# Patient Record
Sex: Female | Born: 1935 | Race: White | Hispanic: No | Marital: Married | Smoking: Never smoker
Health system: Southern US, Community
[De-identification: ages and names within clinical notes are randomized; demographics above are authoritative.]

## PROBLEM LIST (undated history)

## (undated) ENCOUNTER — Emergency Department (HOSPITAL_BASED_OUTPATIENT_CLINIC_OR_DEPARTMENT_OTHER): Admission: EM | Payer: Self-pay | Source: Home / Self Care

## (undated) DIAGNOSIS — F329 Major depressive disorder, single episode, unspecified: Secondary | ICD-10-CM

## (undated) DIAGNOSIS — M069 Rheumatoid arthritis, unspecified: Secondary | ICD-10-CM

## (undated) DIAGNOSIS — M1990 Unspecified osteoarthritis, unspecified site: Secondary | ICD-10-CM

## (undated) DIAGNOSIS — R799 Abnormal finding of blood chemistry, unspecified: Secondary | ICD-10-CM

## (undated) DIAGNOSIS — F32A Depression, unspecified: Secondary | ICD-10-CM

## (undated) DIAGNOSIS — N289 Disorder of kidney and ureter, unspecified: Secondary | ICD-10-CM

## (undated) HISTORY — DX: Unspecified osteoarthritis, unspecified site: M19.90

## (undated) HISTORY — PX: BREAST SURGERY: SHX581

## (undated) HISTORY — PX: KNEE SURGERY: SHX244

## (undated) HISTORY — PX: CARPAL TUNNEL RELEASE: SHX101

## (undated) HISTORY — PX: ABDOMINAL HYSTERECTOMY: SHX81

## (undated) HISTORY — DX: Abnormal finding of blood chemistry, unspecified: R79.9

---

## 2000-10-03 ENCOUNTER — Encounter (INDEPENDENT_AMBULATORY_CARE_PROVIDER_SITE_OTHER): Payer: Self-pay

## 2000-10-03 ENCOUNTER — Ambulatory Visit (HOSPITAL_COMMUNITY): Admission: RE | Admit: 2000-10-03 | Discharge: 2000-10-03 | Payer: Self-pay | Admitting: General Surgery

## 2001-08-28 ENCOUNTER — Encounter: Admission: RE | Admit: 2001-08-28 | Discharge: 2001-11-13 | Payer: Self-pay | Admitting: *Deleted

## 2004-05-26 ENCOUNTER — Encounter (INDEPENDENT_AMBULATORY_CARE_PROVIDER_SITE_OTHER): Payer: Self-pay | Admitting: *Deleted

## 2004-11-24 ENCOUNTER — Ambulatory Visit (HOSPITAL_COMMUNITY): Admission: RE | Admit: 2004-11-24 | Discharge: 2004-11-24 | Payer: Self-pay | Admitting: Plastic Surgery

## 2006-05-09 ENCOUNTER — Encounter: Admission: RE | Admit: 2006-05-09 | Discharge: 2006-08-07 | Payer: Self-pay | Admitting: *Deleted

## 2006-10-12 ENCOUNTER — Encounter: Admission: RE | Admit: 2006-10-12 | Discharge: 2006-10-12 | Payer: Self-pay | Admitting: Orthopedic Surgery

## 2006-10-13 ENCOUNTER — Ambulatory Visit (HOSPITAL_BASED_OUTPATIENT_CLINIC_OR_DEPARTMENT_OTHER): Admission: RE | Admit: 2006-10-13 | Discharge: 2006-10-13 | Payer: Self-pay | Admitting: Orthopedic Surgery

## 2007-08-22 ENCOUNTER — Encounter (INDEPENDENT_AMBULATORY_CARE_PROVIDER_SITE_OTHER): Payer: Self-pay | Admitting: *Deleted

## 2007-08-24 ENCOUNTER — Encounter (INDEPENDENT_AMBULATORY_CARE_PROVIDER_SITE_OTHER): Payer: Self-pay | Admitting: *Deleted

## 2007-09-11 ENCOUNTER — Encounter (INDEPENDENT_AMBULATORY_CARE_PROVIDER_SITE_OTHER): Payer: Self-pay | Admitting: *Deleted

## 2007-11-01 ENCOUNTER — Encounter (INDEPENDENT_AMBULATORY_CARE_PROVIDER_SITE_OTHER): Payer: Self-pay | Admitting: *Deleted

## 2008-07-07 ENCOUNTER — Ambulatory Visit (HOSPITAL_COMMUNITY): Admission: RE | Admit: 2008-07-07 | Discharge: 2008-07-07 | Payer: Self-pay | Admitting: *Deleted

## 2008-07-15 ENCOUNTER — Encounter (INDEPENDENT_AMBULATORY_CARE_PROVIDER_SITE_OTHER): Payer: Self-pay | Admitting: *Deleted

## 2008-08-18 ENCOUNTER — Encounter (INDEPENDENT_AMBULATORY_CARE_PROVIDER_SITE_OTHER): Payer: Self-pay | Admitting: *Deleted

## 2008-08-22 ENCOUNTER — Encounter (INDEPENDENT_AMBULATORY_CARE_PROVIDER_SITE_OTHER): Payer: Self-pay | Admitting: *Deleted

## 2008-08-22 ENCOUNTER — Encounter: Admission: RE | Admit: 2008-08-22 | Discharge: 2008-08-22 | Payer: Self-pay | Admitting: Gastroenterology

## 2008-10-09 ENCOUNTER — Emergency Department (HOSPITAL_COMMUNITY): Admission: EM | Admit: 2008-10-09 | Discharge: 2008-10-09 | Payer: Self-pay | Admitting: Emergency Medicine

## 2008-10-23 ENCOUNTER — Ambulatory Visit: Payer: Self-pay | Admitting: *Deleted

## 2008-10-23 DIAGNOSIS — K219 Gastro-esophageal reflux disease without esophagitis: Secondary | ICD-10-CM | POA: Insufficient documentation

## 2008-10-23 DIAGNOSIS — G4700 Insomnia, unspecified: Secondary | ICD-10-CM | POA: Insufficient documentation

## 2008-10-23 DIAGNOSIS — E039 Hypothyroidism, unspecified: Secondary | ICD-10-CM | POA: Insufficient documentation

## 2008-10-23 DIAGNOSIS — E785 Hyperlipidemia, unspecified: Secondary | ICD-10-CM | POA: Insufficient documentation

## 2008-10-23 DIAGNOSIS — F411 Generalized anxiety disorder: Secondary | ICD-10-CM | POA: Insufficient documentation

## 2008-10-23 DIAGNOSIS — F329 Major depressive disorder, single episode, unspecified: Secondary | ICD-10-CM | POA: Insufficient documentation

## 2008-10-23 DIAGNOSIS — Z853 Personal history of malignant neoplasm of breast: Secondary | ICD-10-CM | POA: Insufficient documentation

## 2008-10-23 DIAGNOSIS — K900 Celiac disease: Secondary | ICD-10-CM | POA: Insufficient documentation

## 2008-10-23 DIAGNOSIS — Z85828 Personal history of other malignant neoplasm of skin: Secondary | ICD-10-CM | POA: Insufficient documentation

## 2008-10-23 DIAGNOSIS — M1990 Unspecified osteoarthritis, unspecified site: Secondary | ICD-10-CM | POA: Insufficient documentation

## 2008-10-23 DIAGNOSIS — M3500 Sicca syndrome, unspecified: Secondary | ICD-10-CM | POA: Insufficient documentation

## 2008-10-23 DIAGNOSIS — Z8679 Personal history of other diseases of the circulatory system: Secondary | ICD-10-CM | POA: Insufficient documentation

## 2008-10-28 ENCOUNTER — Encounter (INDEPENDENT_AMBULATORY_CARE_PROVIDER_SITE_OTHER): Payer: Self-pay | Admitting: *Deleted

## 2008-11-21 DIAGNOSIS — K589 Irritable bowel syndrome without diarrhea: Secondary | ICD-10-CM | POA: Insufficient documentation

## 2008-11-21 DIAGNOSIS — R1084 Generalized abdominal pain: Secondary | ICD-10-CM | POA: Insufficient documentation

## 2008-11-21 DIAGNOSIS — E739 Lactose intolerance, unspecified: Secondary | ICD-10-CM | POA: Insufficient documentation

## 2008-11-21 DIAGNOSIS — K5900 Constipation, unspecified: Secondary | ICD-10-CM | POA: Insufficient documentation

## 2008-11-21 DIAGNOSIS — I252 Old myocardial infarction: Secondary | ICD-10-CM | POA: Insufficient documentation

## 2008-11-21 DIAGNOSIS — I499 Cardiac arrhythmia, unspecified: Secondary | ICD-10-CM | POA: Insufficient documentation

## 2008-11-21 DIAGNOSIS — Z8669 Personal history of other diseases of the nervous system and sense organs: Secondary | ICD-10-CM | POA: Insufficient documentation

## 2008-12-26 ENCOUNTER — Telehealth (INDEPENDENT_AMBULATORY_CARE_PROVIDER_SITE_OTHER): Payer: Self-pay | Admitting: *Deleted

## 2008-12-29 ENCOUNTER — Telehealth (INDEPENDENT_AMBULATORY_CARE_PROVIDER_SITE_OTHER): Payer: Self-pay | Admitting: *Deleted

## 2008-12-29 ENCOUNTER — Ambulatory Visit: Payer: Self-pay | Admitting: *Deleted

## 2008-12-29 DIAGNOSIS — R3989 Other symptoms and signs involving the genitourinary system: Secondary | ICD-10-CM | POA: Insufficient documentation

## 2008-12-29 LAB — CONVERTED CEMR LAB
Bilirubin Urine: NEGATIVE
Blood in Urine, dipstick: NEGATIVE
Glucose, Urine, Semiquant: NEGATIVE
Ketones, urine, test strip: NEGATIVE
Protein, U semiquant: NEGATIVE
pH: 5

## 2009-01-02 DIAGNOSIS — E871 Hypo-osmolality and hyponatremia: Secondary | ICD-10-CM | POA: Insufficient documentation

## 2009-01-02 DIAGNOSIS — D72819 Decreased white blood cell count, unspecified: Secondary | ICD-10-CM | POA: Insufficient documentation

## 2009-01-02 LAB — CONVERTED CEMR LAB
ALT: 19 units/L (ref 0–35)
AST: 19 units/L (ref 0–37)
Alkaline Phosphatase: 47 units/L (ref 39–117)
Basophils Absolute: 0 10*3/uL (ref 0.0–0.1)
Basophils Relative: 0.2 % (ref 0.0–3.0)
Calcium: 9.7 mg/dL (ref 8.4–10.5)
Chloride: 95 meq/L — ABNORMAL LOW (ref 96–112)
Cholesterol: 174 mg/dL (ref 0–200)
Creatinine, Ser: 0.8 mg/dL (ref 0.4–1.2)
Eosinophils Absolute: 0 10*3/uL (ref 0.0–0.7)
HDL: 71.9 mg/dL (ref >39.0)
MCHC: 33.9 g/dL (ref 30.0–36.0)
MCV: 98.4 fL (ref 78.0–100.0)
Neutrophils Relative %: 63.5 % (ref 43.0–77.0)
Platelets: 259 10*3/uL (ref 150–400)
RBC: 3.88 M/uL (ref 3.87–5.11)
Total Bilirubin: 1.7 mg/dL — ABNORMAL HIGH (ref 0.3–1.2)
VLDL: 15 mg/dL (ref 0–40)

## 2009-01-19 ENCOUNTER — Telehealth (INDEPENDENT_AMBULATORY_CARE_PROVIDER_SITE_OTHER): Payer: Self-pay | Admitting: *Deleted

## 2009-01-21 ENCOUNTER — Telehealth (INDEPENDENT_AMBULATORY_CARE_PROVIDER_SITE_OTHER): Payer: Self-pay | Admitting: *Deleted

## 2009-01-21 ENCOUNTER — Ambulatory Visit: Payer: Self-pay | Admitting: *Deleted

## 2009-01-21 LAB — CONVERTED CEMR LAB
AST: 27 units/L (ref 0–37)
Alkaline Phosphatase: 54 units/L (ref 39–117)
BUN: 10 mg/dL (ref 6–23)
Basophils Relative: 0.3 % (ref 0.0–3.0)
Creatinine, Ser: 0.7 mg/dL (ref 0.4–1.2)
Eosinophils Absolute: 0 10*3/uL (ref 0.0–0.7)
Eosinophils Relative: 0.7 % (ref 0.0–5.0)
GFR calc non Af Amer: 87 mL/min
Glucose, Bld: 102 mg/dL — ABNORMAL HIGH (ref 70–99)
HCT: 37 % (ref 36.0–46.0)
Hemoglobin: 12.8 g/dL (ref 12.0–15.0)
MCV: 98 fL (ref 78.0–100.0)
Monocytes Absolute: 0.6 10*3/uL (ref 0.1–1.0)
Neutro Abs: 3.8 10*3/uL (ref 1.4–7.7)
RBC: 3.78 M/uL — ABNORMAL LOW (ref 3.87–5.11)
WBC: 5.6 10*3/uL (ref 4.5–10.5)

## 2009-01-22 ENCOUNTER — Telehealth (INDEPENDENT_AMBULATORY_CARE_PROVIDER_SITE_OTHER): Payer: Self-pay | Admitting: *Deleted

## 2009-01-26 ENCOUNTER — Ambulatory Visit: Payer: Self-pay | Admitting: *Deleted

## 2009-01-26 DIAGNOSIS — M2550 Pain in unspecified joint: Secondary | ICD-10-CM | POA: Insufficient documentation

## 2009-08-18 ENCOUNTER — Telehealth: Payer: Self-pay | Admitting: Internal Medicine

## 2009-10-10 IMAGING — NM NM HEPATO W/GB/PHARM/[PERSON_NAME]
2 series · 12 of 12 positions shown · non-contrast
Comparison: None.

CLINICAL DATA: 76-year-old female with abdominal pain, nausea and
gallbladder sludge.

NUCLEAR MEDICINE HEPATOBILIARY IMAGING WITH GALLBLADDER EF
TECHNIQUE: Sequential images of the abdomen were obtained [DATE] minutes following intravenous administration of
radiopharmaceutical. After oral ingestion of 8oz of half and half
cream, gallbladder ejection fraction was determined.
Radiopharmaceutical:  3.AmWi Xc-QQm Choletec

[Series 1: he hepato · 4.71mm/px · 6 of 60 frames shown (1 of 2)]
[frame 6/60]
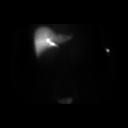
[frame 16/60]
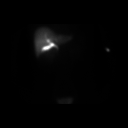
[frame 26/60]
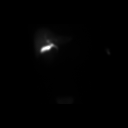
[frame 36/60]
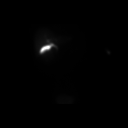
[frame 46/60]
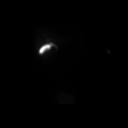
[frame 56/60]
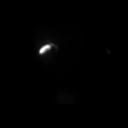

[Series 1: he hepato · 4.71mm/px · 6 of 60 frames shown (2 of 2)]
[frame 6/60]
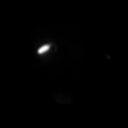
[frame 16/60]
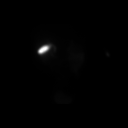
[frame 26/60]
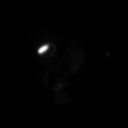
[frame 36/60]
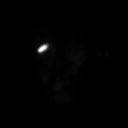
[frame 46/60]
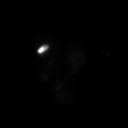
[frame 56/60]
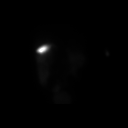

[12 of 12 positions shown; findings below may reference images not displayed]

FINDINGS: Good radiotracer activity uptake in the liver and
clearance of the blood pool.  Common bile duct activity is evident
by 10 minutes and gallbladder activity is seen at 15 minutes and
increases over the course of the study.  After 1 hour, no definite
small bowel activity is identified, but is noted in the second
portion of the exam.

The patient was given 8 ounces of half-and-half orally.  The
patient did not experience symptoms during oral ingestion of Half-
and-Half cream. Gallbladder ejection fraction is calculated at 39%
over 54 minutes.  There is some reaccumulation of radiotracer
activity in the gallbladder at the conclusion of this portion of
the exam. Normal gallbladder ejection fraction is considered to be
greater than 50% at 1 hour.
IMPRESSION: 1.  Decreased gallbladder ejection fraction, 39%.
2.  Otherwise unremarkable hepatic biliary scan.

## 2009-11-25 IMAGING — US US ABDOMEN COMPLETE
1 series · 14 of 25 positions shown · non-contrast
Comparison: None

CLINICAL DATA: Abdominal pain

ABDOMEN ULTRASOUND
TECHNIQUE: Complete abdominal ultrasound examination was performed
including evaluation of the liver, gallbladder, bile ducts,
pancreas, kidneys, spleen, IVC, and abdominal aorta.

[Series 1: us abdomen complete · 0.17mm/px · 14 of 66 slices shown]
[im 1/66]
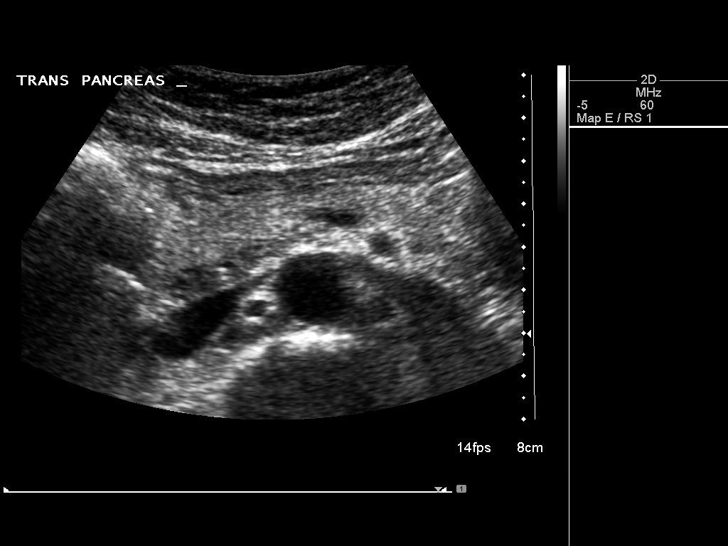
[im 6/66]
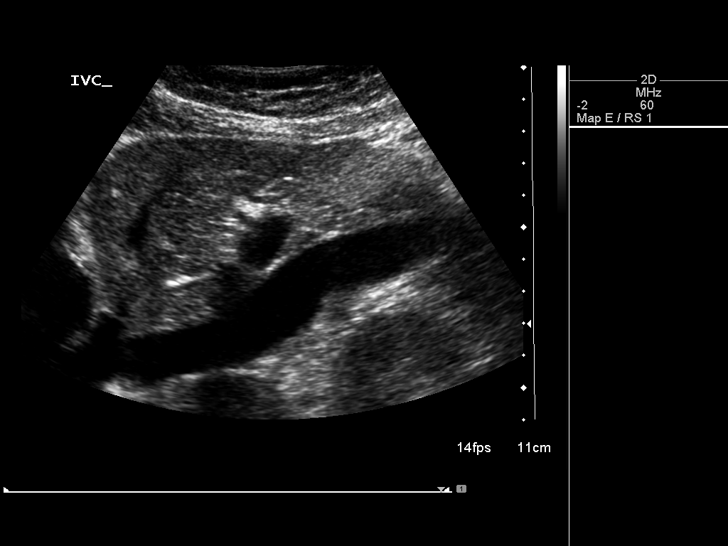
[im 11/66]
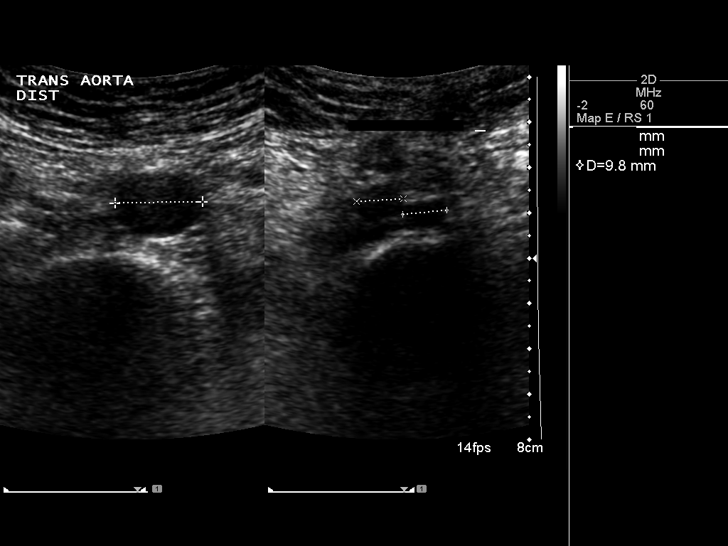
[im 17/66]
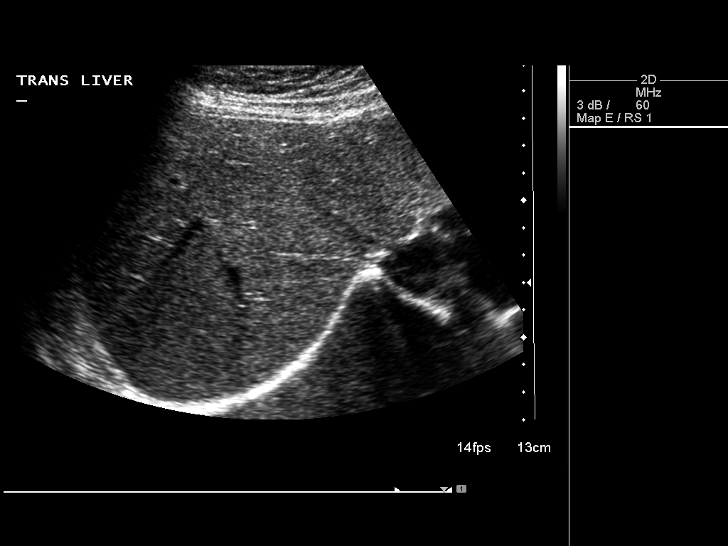
[im 22/66]
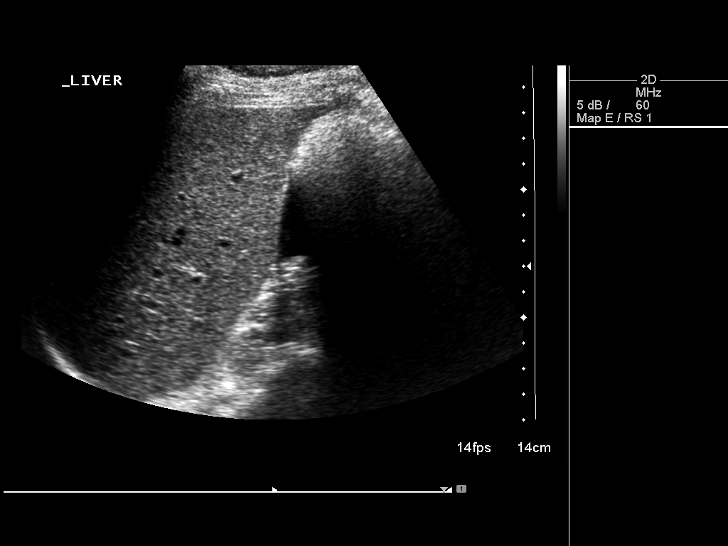
[im 25/66]
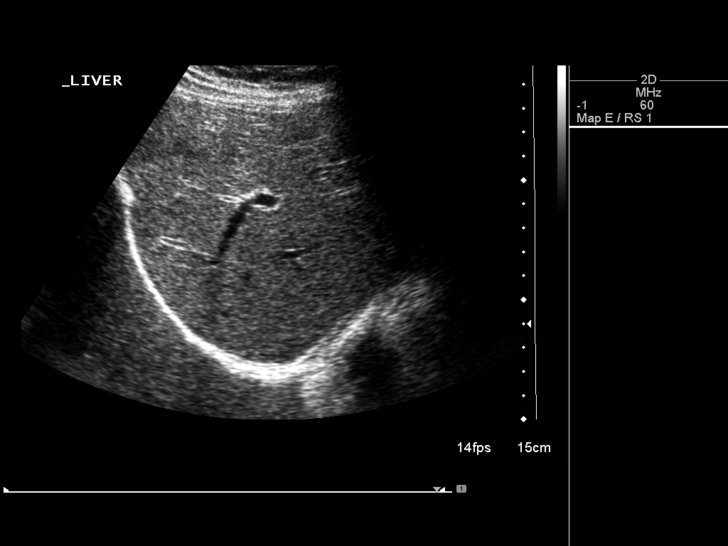
[im 30/66]
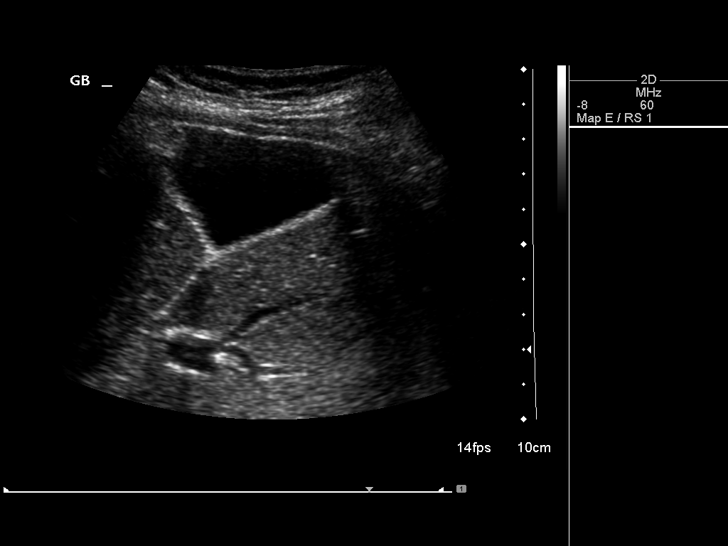
[im 36/66]
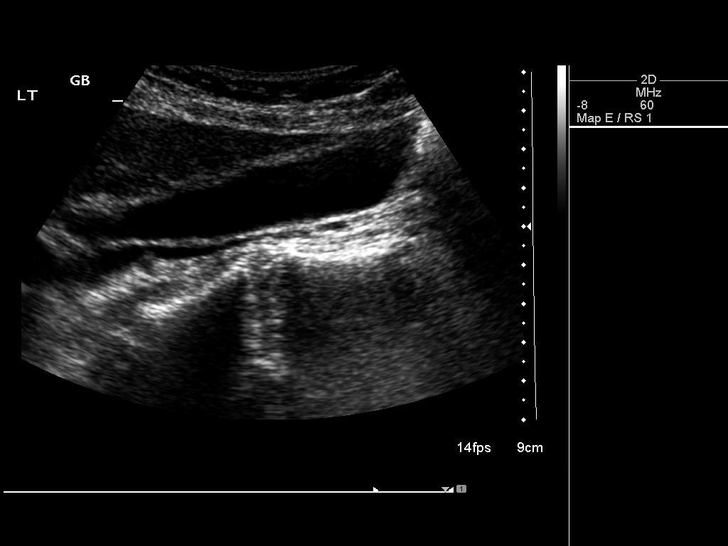
[im 41/66]
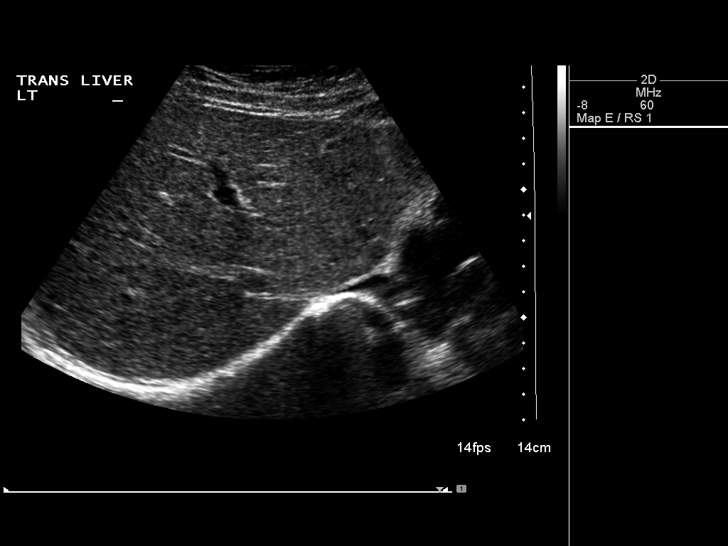
[im 44/66]
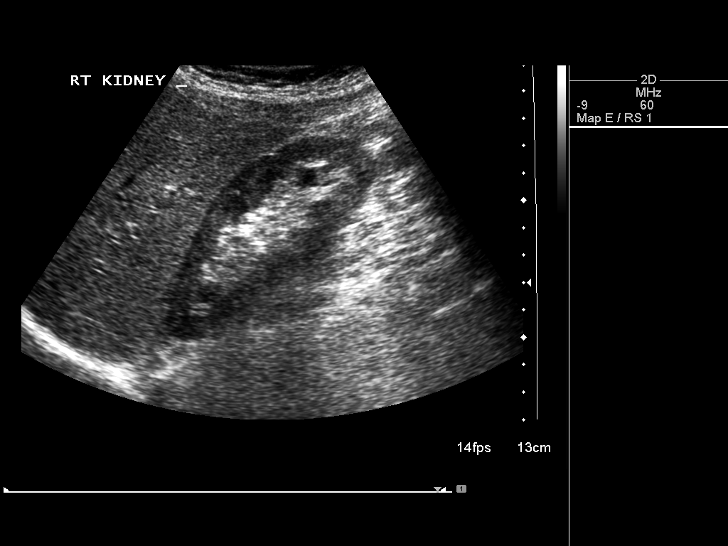
[im 49/66]
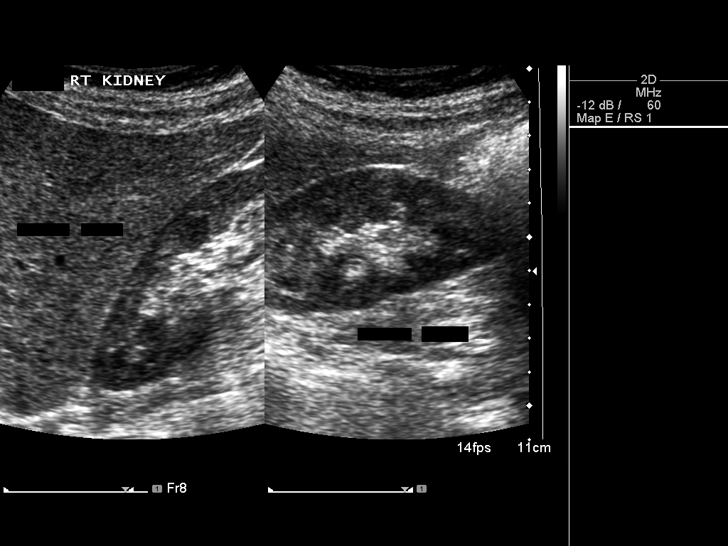
[im 55/66]
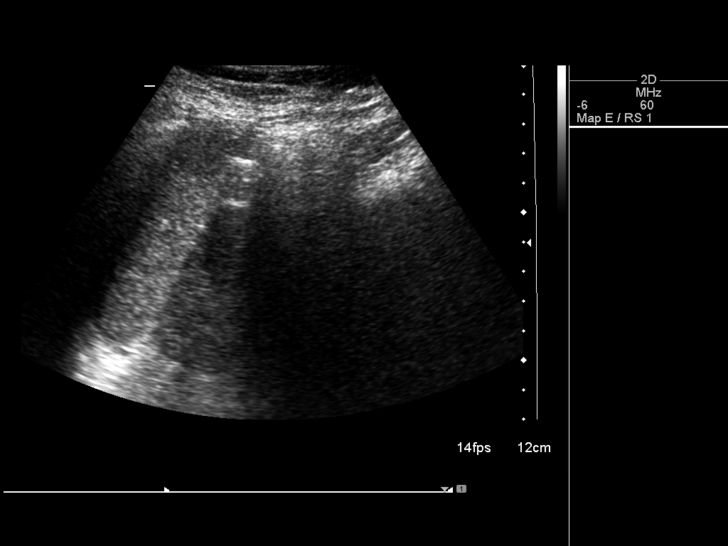
[im 60/66]
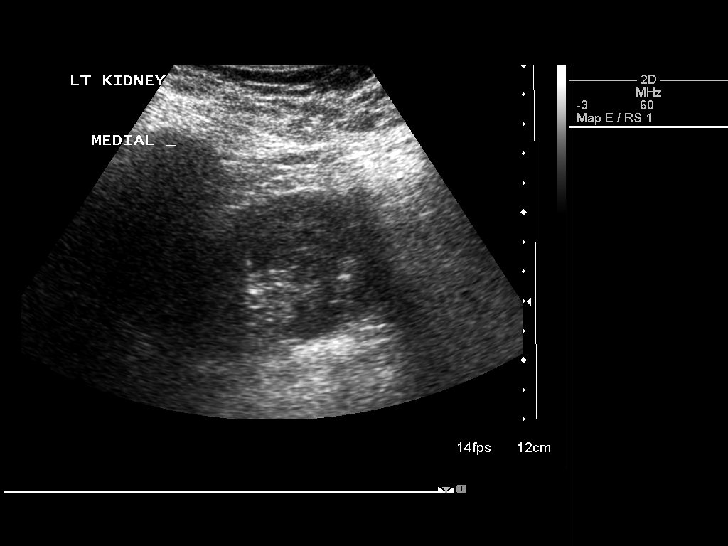
[im 66/66]
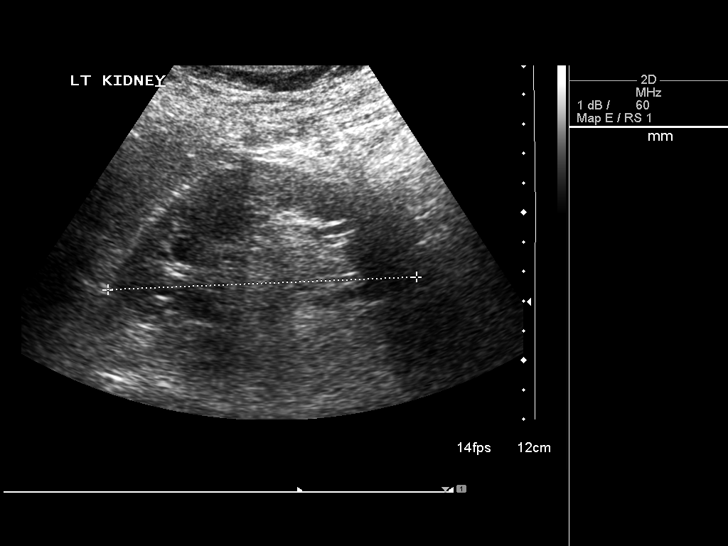

[14 of 25 positions shown; findings below may reference images not displayed]

FINDINGS: The gallbladder is well seen and no gallstones are noted.
The liver has a normal echogenic pattern.  The common bile duct is
normal measuring 2.8 mm in diameter.  The IVC, pancreas, and spleen
appear normal.  No hydronephrosis is seen.  The right kidney
measures 10.3 cm sagittally, with the left kidney measuring
cm.  The abdominal aorta is normal in caliber.
IMPRESSION: Negative ultrasound.  No gallstones.  No ductal dilatation.

## 2011-04-01 NOTE — Op Note (Signed)
[facility]  Patient:    Vickie Roberts, Vickie Roberts                        MRN: [id number] Proc. Date: 10/03/00 Adm. Date:  [id number] Attending:  Carson Myrtle CC:         Festus Holts, M.D.   Operative Report  PREOPERATIVE DIAGNOSIS:  Mass, right breast.  POSTOPERATIVE DIAGNOSIS:  Mass, right breast.  PROCEDURE:  Excision of mass, right breast.  SURGEON:  Timothy E. Earlene Plater, M.D.  ANESTHESIA:  IV sedation, CRNA standby.  INDICATIONS:  The patient is an otherwise healthy 77 year old.  She has previously had bilateral subcutaneous mastectomies because of a high risk for cancer and implants have been replaced.  She developed a nodular mass at the upper outer quadrant of the right implant, and because of the suspicious nature, she wishes to have a biopsy, and I agree.  She agrees and understands the procedure.  DESCRIPTION OF PROCEDURE:  The patient was taken to the operating room, IV started, and sedation given.  She was placed in the elevated left side down position with the right arm outstretched.  The breast was prepped and draped in the usual fashion.  The mass was palpable at the outer edge of the implant at approximately the 10 oclock position.  An old incision from a previous biopsy was present.  This area was anesthetized and the old scar was used to enter the subcutaneous space.  The nodularity appeared to be scar tissue at the edge of the implant and the border of the pectoralis muscle.  The nodularity was excised sharply.  Small bleeding points were cauterized. Several small areas were removed and submitted together for pathology in _______.  The area was clean and smooth after this.  Bleeding was controlled. The wound was dry and it was closed with 3-0 Vicryl and 4-0 Monocryl. Steri-Strips applied.  Counts correct.  She tolerated it well and was removed to the recovery room in good condition.  Written and verbal instructions were  given to her and her daughter, Darvocet-N #24 one refill, and she will be seen in follow up in the office. DD:  10/03/00 TD:  10/04/00 Job: [id number]

## 2011-04-01 NOTE — Op Note (Signed)
[name], Vickie Roberts                 ACCOUNT NO.:  [id number]   MEDICAL RECORD NO.:  [id number]          PATIENT TYPE:  AMB   LOCATION:  DSC                          FACILITY:  [facility]   PHYSICIAN:  Harvie Junior, M.D.   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   PRINCIPAL PROCEDURE:  1. Partial posterior horn medial meniscectomy.  2. Debridement of chondromalacia in the lateral compartment and the      patellofemoral compartment.   SURGEON:  Harvie Junior, M.D.   ASSISTANT:  Marshia Ly, P.A.   ANESTHESIA:  General.   BRIEF HISTORY:  [name] is a 77 year old female with a long history of  having had significant knee pain.  We have treated her conservatively  for a long period of time.  Anti-inflammatory medication injection has  failed her and because of continued complaints of pain, she is  ultimately taken to the operating room for operative knee arthroscopy.  Preoperative plain x-ray did not show any dramatic bone-on-bone  narrowing and preoperative MRI was negative for meniscal tear.  She was  brought to the operating room after failure of conservative care for an  operative knee arthroscopy.   PROCEDURE:  The patient was brought to the operating room.  After  adequate anesthesia was obtained with general anesthetic, the patient  was placed supine on the operating table.  The right leg was then  prepped and draped in the usual, sterile fashion.  Following this,  routine arthroscopic examination of the knee revealed there was obvious  and significant chondromalacia in the medial compartment.  She also had  a posterior horn medial meniscal tear which  was debrided back to a  smooth and stable rim.  The chondromalacia in the medial compartment,  lateral compartment and patellofemoral compartment was debrided by way  of chondroplasty.  Once we had completed this process, a final check was  made for loose fragments or pieces of cartilage.  Seeing none, the knee  was copiously and thoroughly irrigated and suctioned dry.  The  arthroscopic portal was closed with a bandage.  Sterile compressive  dressing was applied and the patient was taken to the recovery room  where she was noted to be in satisfactory condition.   ESTIMATED BLOOD LOSS:  Estimated blood loss for the procedure was none.      Harvie Junior, M.D.  Electronically Signed     JLG/MEDQ  D:  11/19/2006  T:  11/19/2006  Job:  [id number]

## 2011-08-16 LAB — URINALYSIS, ROUTINE W REFLEX MICROSCOPIC
Glucose, UA: NEGATIVE
Nitrite: NEGATIVE
Protein, ur: NEGATIVE
pH: 7.5

## 2011-08-16 LAB — POCT I-STAT, CHEM 8
BUN: 7
Chloride: 94 — ABNORMAL LOW
HCT: 39
Sodium: 131 — ABNORMAL LOW
TCO2: 26

## 2014-12-17 ENCOUNTER — Ambulatory Visit
Admission: RE | Admit: 2014-12-17 | Discharge: 2014-12-17 | Disposition: A | Discharge status: Home / Self Care | Payer: Medicare Other | Source: Ambulatory Visit | Attending: Family Medicine | Admitting: Family Medicine

## 2014-12-17 ENCOUNTER — Other Ambulatory Visit: Payer: Self-pay | Admitting: Family Medicine

## 2014-12-17 DIAGNOSIS — R059 Cough, unspecified: Secondary | ICD-10-CM

## 2014-12-17 DIAGNOSIS — R05 Cough: Secondary | ICD-10-CM

## 2015-04-14 ENCOUNTER — Encounter (HOSPITAL_BASED_OUTPATIENT_CLINIC_OR_DEPARTMENT_OTHER): Payer: Self-pay | Admitting: Emergency Medicine

## 2015-04-14 ENCOUNTER — Emergency Department (HOSPITAL_BASED_OUTPATIENT_CLINIC_OR_DEPARTMENT_OTHER)
Admission: EM | Admit: 2015-04-14 | Discharge: 2015-04-15 | Disposition: A | Discharge status: Home / Self Care | Payer: Medicare Other | Attending: Emergency Medicine | Admitting: Emergency Medicine

## 2015-04-14 ENCOUNTER — Emergency Department (HOSPITAL_BASED_OUTPATIENT_CLINIC_OR_DEPARTMENT_OTHER): Payer: Medicare Other

## 2015-04-14 DIAGNOSIS — Z791 Long term (current) use of non-steroidal anti-inflammatories (NSAID): Secondary | ICD-10-CM | POA: Insufficient documentation

## 2015-04-14 DIAGNOSIS — S20211A Contusion of right front wall of thorax, initial encounter: Secondary | ICD-10-CM | POA: Insufficient documentation

## 2015-04-14 DIAGNOSIS — Z79899 Other long term (current) drug therapy: Secondary | ICD-10-CM | POA: Diagnosis not present

## 2015-04-14 DIAGNOSIS — W050XXA Fall from non-moving wheelchair, initial encounter: Secondary | ICD-10-CM | POA: Diagnosis not present

## 2015-04-14 DIAGNOSIS — S0003XA Contusion of scalp, initial encounter: Secondary | ICD-10-CM | POA: Diagnosis not present

## 2015-04-14 DIAGNOSIS — F329 Major depressive disorder, single episode, unspecified: Secondary | ICD-10-CM | POA: Insufficient documentation

## 2015-04-14 DIAGNOSIS — Y998 Other external cause status: Secondary | ICD-10-CM | POA: Diagnosis not present

## 2015-04-14 DIAGNOSIS — Y9389 Activity, other specified: Secondary | ICD-10-CM | POA: Diagnosis not present

## 2015-04-14 DIAGNOSIS — Z7952 Long term (current) use of systemic steroids: Secondary | ICD-10-CM | POA: Diagnosis not present

## 2015-04-14 DIAGNOSIS — M069 Rheumatoid arthritis, unspecified: Secondary | ICD-10-CM | POA: Diagnosis not present

## 2015-04-14 DIAGNOSIS — W19XXXA Unspecified fall, initial encounter: Secondary | ICD-10-CM

## 2015-04-14 DIAGNOSIS — S299XXA Unspecified injury of thorax, initial encounter: Secondary | ICD-10-CM | POA: Diagnosis present

## 2015-04-14 DIAGNOSIS — Y92009 Unspecified place in unspecified non-institutional (private) residence as the place of occurrence of the external cause: Secondary | ICD-10-CM | POA: Diagnosis not present

## 2015-04-14 HISTORY — DX: Major depressive disorder, single episode, unspecified: F32.9

## 2015-04-14 HISTORY — DX: Rheumatoid arthritis, unspecified: M06.9

## 2015-04-14 HISTORY — DX: Depression, unspecified: F32.A

## 2015-04-14 NOTE — ED Notes (Signed)
Slipped while maneuvering a wheelchair, hitting right side of head and right ribs.  Area of swelling on head.  Skin intact.  No blood thinners.

## 2015-04-14 NOTE — ED Notes (Signed)
Pt loss balance and fell  C/o pain to rt back area, and left side of head  Small hematoma,  No loc,  Denies n/v, no blurred vision

## 2015-04-15 NOTE — ED Provider Notes (Signed)
CSN: [id number]     Arrival date & time 04/14/15  2026 History   First MD Initiated Contact with Patient 04/15/15 0030     Chief Complaint  Patient presents with  . Fall     (Consider location/radiation/quality/duration/timing/severity/associated sxs/prior Treatment) HPI  This is an 81 year old female with a history of sciatica. Because of her sciatica she ambulates either with the use of walker or uses a wheelchair. She was reaching for her wheelchair yesterday evening about 7 PM. The break on the wheelchair was not set and it slid and she fell to the ground. She struck her right lower posterior rib cage and the right side of her head. There was no loss of consciousness. She is having no nausea or vomiting. She denies any neck or spinal pain. Pain in the back is moderate to severe with palpation, mild with deep breathing or coughing. She denies other injury.  Past Medical History  Diagnosis Date  . RA (rheumatoid arthritis)   . Depression    Past Surgical History  Procedure Laterality Date  . Knee surgery    . Carpal tunnel release    . Breast surgery     No family history on file. History  Substance Use Topics  . Smoking status: Never Smoker   . Smokeless tobacco: Not on file  . Alcohol Use: No   OB History    No data available     Review of Systems  All other systems reviewed and are negative.   Allergies  Erythromycin; Hydrocodone; and Sulfonamide derivatives  Home Medications   Prior to Admission medications   Medication Sig Start Date End Date Taking? Authorizing Provider  buPROPion (WELLBUTRIN) 100 MG tablet Take 150 mg by mouth daily.   Yes Historical Provider, MD  clonazePAM (KLONOPIN) 0.5 MG tablet Take 0.5 mg by mouth at bedtime.   Yes Historical Provider, MD  L-Methylfolate-Algae (DEPLIN 7.5 PO) Take by mouth.   Yes Historical Provider, MD  predniSONE (DELTASONE) 5 MG tablet Take 5 mg by mouth daily with breakfast.   Yes Historical Provider, MD  traMADol  (ULTRAM) 50 MG tablet Take 100 mg by mouth 2 (two) times daily.   Yes Historical Provider, MD   BP 135/89 mmHg  Pulse 66  Temp(Src) 98.1 F (36.7 C) (Oral)  Resp 16  Ht 5\' 4"  (1.626 m)  Wt 153 lb (69.4 kg)  BMI 26.25 kg/m2  SpO2 98%   Physical Exam  General: Well-developed, well-nourished female in no acute distress; appearance consistent with age of record HENT: normocephalic; small hematoma to right parietal scalp; no hemotympanum Eyes: pupils equal, round and reactive to light; extraocular muscles intact; lens implants Neck: supple; nontender Heart: regular rate and rhythm Lungs: clear to auscultation bilaterally Chest: Well localized tender, ecchymotic area of right lower posterior ribs without crepitus or deformity Abdomen: soft; nondistended; nontender; bowel sounds present Extremities: No deformity; full range of motion except right hip due to sciatic pain; pulses normal; trace edema of lower legs Neurologic: Awake, alert and oriented; motor function intact in all extremities and symmetric; no facial droop Skin: Warm and dry Psychiatric: Normal mood and affect    ED Course  Procedures (including critical care time)   MDM  Nursing notes and vitals signs, including pulse oximetry, reviewed.  Summary of this visit's results, reviewed by myself:  Imaging Studies: Dg Ribs Unilateral W/chest Right  04/14/2015   CLINICAL DATA:  Slipped while moving wheelchair with right-sided rib pain, initial encounter  EXAM: RIGHT  RIBS AND CHEST - 3+ VIEW  COMPARISON:  None.  FINDINGS: No fracture or other bone lesions are seen involving the ribs. There is no evidence of pneumothorax or pleural effusion. Both lungs are clear. Heart size and mediastinal contours are within normal limits.  IMPRESSION: No acute abnormality noted.   Electronically Signed   By: Alcide Clever M.D.   On: 04/14/2015 21:45   The patient has tramadol and ibuprofen at home for pain. Although the patient has point  tenderness lack of significant pain on deep breathing or coughing suggests she does not have a rib fracture.   Paula Libra, MD 04/15/15 [phone]

## 2015-06-15 ENCOUNTER — Encounter: Payer: Self-pay | Admitting: Neurology

## 2015-06-15 ENCOUNTER — Ambulatory Visit (INDEPENDENT_AMBULATORY_CARE_PROVIDER_SITE_OTHER): Payer: Medicare Other | Admitting: Neurology

## 2015-06-15 VITALS — BP 126/60 | HR 88 | Resp 16 | Ht 64.0 in | Wt 142.0 lb

## 2015-06-15 DIAGNOSIS — M79604 Pain in right leg: Secondary | ICD-10-CM

## 2015-06-15 NOTE — Progress Notes (Signed)
Subjective:    Patient ID: Vickie Roberts is a 81 y.o. female.  HPI     Huston Foley, MD, PhD [facility] [address] [address] [place], [place] [address]  Dear Dr. Luiz Blare,   I saw your patient, Vickie Roberts, upon your kind request in my neurologic clinic today for initial consultation of her right leg pain and weakness. The patient is accompanied by her husband today. 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 Pain is from the right gluteal region, down the lateral aspect of her right leg, into the foot. She felt symptoms started gradually and progressed but then improved slightly. She is in outpatient physical therapy. She has a muscle relaxer. She does not take this very often. She has had 2 rounds of oral stare rides and also 3 injections into her lower back. Her husband feels that she has improved some. She has no family history of muscle or nerve diseases. She saw a foot doctor and has been seeing a [facility]. You considered injections into the right knee but she declined at the time. I reviewed your office note from 05/21/2015 which you kindly included. Of note, she presented to the emergency room on 04/14/2015 after a fall. I reviewed the emergency room records. At her most recent baseline she has been using a walker or wheelchair depending on her lower back pain level. She fell as she was reaching for her wheelchair which was not stabilized and rolled away and she fell onto her right side and hit her head. She had no loss of consciousness. She had a rib and chest x-ray on 04/14/2015 that showed no acute abnormalities.  She had a MRI lumbar spine without contrast on 03/03/2015: Diffuse facet  osteoarthritis with mild degenerative disc disease as above. No nerve impingement or explanation for radicular symptoms.   Her Past Medical History Is Significant For: Past Medical History  Diagnosis Date  . RA (rheumatoid arthritis)   . Depression   . Osteoarthritis     Her Past Surgical History Is Significant For: Past Surgical History  Procedure Laterality Date  . Knee surgery    . Carpal tunnel release    . Breast surgery    . Abdominal hysterectomy      Her Family History Is Significant For: Family History  Problem Relation Age of Onset  . Heart disease Mother   . Breast cancer Mother   . Heart disease Father     Her Social History Is Significant For: History   Social History  . Marital Status: Married    Spouse Name: N/A  . Number of Children: 4  . Years of Education: HS   Occupational History  . Retired    Social History Main Topics  . Smoking status: Never Smoker   . Smokeless tobacco: Not on file  . Alcohol Use: No  . Drug Use: No  . Sexual Activity: Yes    Birth Control/ Protection: Post-menopausal   Other Topics Concern  . None   Social History Narrative   Occasional caffeine use     Her Allergies Are:  Allergies  Allergen Reactions  . Erythromycin   . Hydrocodone   . Sulfonamide Derivatives   :   Her Current Medications Are:  Outpatient Encounter Prescriptions as of 06/15/2015  Medication Sig  . aspirin 81 MG tablet Take 81 mg by mouth daily.  . Biotin 1 MG CAPS Take by mouth.  Marland Kitchen buPROPion (WELLBUTRIN SR) 150 MG 12 hr tablet   . buPROPion (WELLBUTRIN) 100 MG tablet Take 150 mg by mouth daily.  . cholecalciferol (VITAMIN D) 1000 UNITS tablet Take 1,000 Units by mouth daily.  . clonazePAM (KLONOPIN) 0.5 MG tablet Take 0.5 mg by mouth at bedtime.  Marland Kitchen L-Methylfolate-Algae (DEPLIN 7.5 PO) Take by mouth.  . leflunomide (ARAVA) 20 MG tablet   . methocarbamol (ROBAXIN) 500 MG tablet Take 500 mg by mouth 4 (four) times daily.  . predniSONE  (DELTASONE) 5 MG tablet Take 5 mg by mouth daily with breakfast.  . [DISCONTINUED] traMADol (ULTRAM) 50 MG tablet Take 100 mg by mouth 2 (two) times daily.   No facility-administered encounter medications on file as of 06/15/2015.  :   Review of Systems:  Out of a complete 14 point review of systems, all are reviewed and negative with the exception of these symptoms as listed below:  Review of Systems  Constitutional: Positive for fatigue.  Gastrointestinal: Positive for diarrhea.  Neurological: Positive for weakness.  Psychiatric/Behavioral:       Depression, anxiety, decreased energy     Objective:  Neurologic Exam  Physical Exam Physical Examination:   Filed Vitals:   06/15/15 1331  BP: 126/60  Pulse: 88  Resp: 16   General Examination: The patient is a very pleasant 81 y.o. female in no acute distress. She appears well-developed and well-nourished and well groomed. She is situated in her wheelchair. She did not bring a walker or cane today.  HEENT: Normocephalic, atraumatic, pupils are equal, round and reactive to light and accommodation. Funduscopic exam is normal with sharp disc margins noted. Extraocular tracking is good without limitation to gaze excursion or nystagmus noted. Normal smooth pursuit is noted. Hearing is grossly intact. Tympanic membranes are clear bilaterally. Face is symmetric with normal facial animation and normal facial sensation. Speech is clear with no dysarthria noted. There is no hypophonia. There is no lip, neck/head, jaw or voice tremor. Neck is supple with full range of passive and active motion. There are no carotid bruits on auscultation. Oropharynx exam reveals: mild mouth dryness, adequate dental hygiene and mild airway.  Chest: Clear to auscultation without wheezing, rhonchi or crackles noted.  Heart: S1+S2+0, regular and normal without murmurs, rubs or gallops noted.   Abdomen: Soft, non-tender and non-distended with normal bowel sounds  appreciated on auscultation.  Extremities: There is no pitting edema in the distal lower extremities bilaterally. Pedal pulses are intact.  Skin: Warm and dry without trophic changes noted. There are no varicose veins.  Musculoskeletal: exam reveals no obvious joint deformities, tenderness or joint swelling or erythema.   Neurologically:  Mental status: The patient is awake, alert and oriented in all 4 spheres. Her immediate and remote memory, attention, language skills and fund of knowledge are appropriate. There is no evidence of aphasia, agnosia, apraxia or anomia. Speech is clear with normal prosody and enunciation. Thought process is linear. Mood is normal and affect is normal.  Cranial nerves II - XII are as described above under HEENT exam. In addition: shoulder shrug is normal with equal shoulder height noted. Motor exam: Normal bulk, strength and tone is noted in both upper extremities and left lower extremity. She has a mild degree of weakness in the right lower extremity and the 4+  out of 5 range, possibly limited secondary to pain. There is no drift, tremor or rebound. Romberg is negative. Reflexes are 2+ in the upper extremities, 1+ in both knees, trace in both ankles. Babinski: Toes are flexor bilaterally. Fine motor skills and coordination: intact with normal finger taps, normal hand movements, normal rapid alternating patting, normal foot taps and normal foot agility.  Cerebellar testing: No dysmetria or intention tremor on finger to nose testing. Heel to shin is unremarkable bilaterally. There is no truncal or gait ataxia.  Sensory exam: intact to light touch, pinprick, vibration, temperature sense in the upper and lower extremities, with the exception of mild decrease in temperature and pinprick sensation in the right lateral foot and right distal lateral leg.  Gait, station and balance: She stands with difficulty. No veering to one side is noted. No leaning to one side is noted.  Posture is age-appropriate and stance is slightly wide-based. She walks cautiously with a slight limp on the right side. She turns in 3 steps. Tandem walk is not possible for her. She reports mild pain with standing and walking. It is in the right lower back with some radiation reported.   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 She had an MRI of her lower back and also x-ray of her right knee. Her neurological exam does not suggest a widespread neuropathy. She has preserved reflexes. Her strength is for the most part preserved with some pain limitation noted. She does have a slight limp on gait testing. She is advised to use her walker or cane at all times. I did suggest that we do some blood work including muscle enzymes and inflammatory markers. We will also go ahead and do an EMG nerve conduction test and we will call her with her test results. I will see her back routinely in follow-up in 2 months, sooner if needed. She is encouraged to continue her physical therapy. I'm not quite sure how to explain her pain at this time. We will do further testing and keep her posted as far as the test results. I answered all her questions today and the patient and her husband were in agreement.   Thank you very much for allowing me to participate in the care of this nice patient. If I can be of any further assistance to you please do not hesitate to call me at [phone].  Sincerely,   Huston Foley, MD, PhD

## 2015-06-15 NOTE — Patient Instructions (Signed)
I am not sure how to explain your leg pain.   I would like to investigate things further to look for evidence of neuropathy or nerve damage; therefore, I would like to do some blood work, and electrical testing of your muscles and nerves, which is known as EMG/NCV. Neuropathy or nerve disease or damage can be caused by a variety of causes, most commonly diabetes, some toxins including alcohol or metabolic derangements or hereditary disorders.

## 2015-06-17 LAB — MULTIPLE MYELOMA PANEL, SERUM
ALBUMIN/GLOB SERPL: 1.5 (ref 0.7–1.7)
ALPHA2 GLOB SERPL ELPH-MCNC: 0.8 g/dL (ref 0.4–1.0)
Albumin SerPl Elph-Mcnc: 3.7 g/dL (ref 2.9–4.4)
Alpha 1: 0.3 g/dL (ref 0.0–0.4)
B-GLOBULIN SERPL ELPH-MCNC: 1.1 g/dL (ref 0.7–1.3)
GAMMA GLOB SERPL ELPH-MCNC: 0.5 g/dL (ref 0.4–1.8)
Globulin, Total: 2.6 g/dL (ref 2.2–3.9)
IGA/IMMUNOGLOBULIN A, SERUM: 212 mg/dL (ref 64–422)
IGG (IMMUNOGLOBIN G), SERUM: 542 mg/dL — AB (ref 700–1600)
IGM (IMMUNOGLOBULIN M), SRM: 50 mg/dL (ref 26–217)
TOTAL PROTEIN: 6.3 g/dL (ref 6.0–8.5)

## 2015-06-17 LAB — HGB A1C W/O EAG: Hgb A1c MFr Bld: 5.7 % — ABNORMAL HIGH (ref 4.8–5.6)

## 2015-06-17 LAB — HEAVY METALS PROFILE II, BLOOD
ARSENIC: 6 ug/L (ref 2–23)
CADMIUM: NOT DETECTED ug/L (ref 0.0–1.2)
Lead, Blood: NOT DETECTED ug/dL (ref 0–19)
MERCURY: 4.3 ug/L (ref 0.0–14.9)

## 2015-06-17 LAB — ANA W/REFLEX: Anti Nuclear Antibody(ANA): NEGATIVE

## 2015-06-17 LAB — CK: CK TOTAL: 37 U/L (ref 24–173)

## 2015-06-17 LAB — C-REACTIVE PROTEIN: CRP: 0.3 mg/L (ref 0.0–4.9)

## 2015-06-17 LAB — SEDIMENTATION RATE: SED RATE: 2 mm/h (ref 0–40)

## 2015-06-17 NOTE — Progress Notes (Signed)
Quick Note:  Please call and advise the patient that the recent labs we checked were within normal limits. No further action is required on these tests at this time. Please remind patient to keep any upcoming appointments or tests and to call us with any interim questions, concerns, problems or updates. Thanks,  Arelis Neumeier, MD, PhD    ______ 

## 2015-06-18 ENCOUNTER — Telehealth: Payer: Self-pay

## 2015-06-18 NOTE — Telephone Encounter (Signed)
Spoke to husband and gave him results and recommendation. (Patient was sleeping)

## 2015-06-18 NOTE — Telephone Encounter (Signed)
-----   Message from Huston Foley, MD sent at 06/17/2015  8:10 AM EDT ----- Please call and advise the patient that the recent labs we checked were within normal limits. No further action is required on these tests at this time. Please remind patient to keep any upcoming appointments or tests and to call us with any interim questions, concerns, problems or updates. Thanks,  Huston Foley, MD, PhD

## 2015-06-29 ENCOUNTER — Ambulatory Visit: Payer: Self-pay | Admitting: Neurology

## 2015-06-30 ENCOUNTER — Ambulatory Visit (INDEPENDENT_AMBULATORY_CARE_PROVIDER_SITE_OTHER): Payer: Medicare Other | Admitting: Neurology

## 2015-06-30 ENCOUNTER — Encounter: Payer: Self-pay | Admitting: Neurology

## 2015-06-30 ENCOUNTER — Ambulatory Visit (INDEPENDENT_AMBULATORY_CARE_PROVIDER_SITE_OTHER): Payer: Self-pay | Admitting: Neurology

## 2015-06-30 DIAGNOSIS — M79604 Pain in right leg: Secondary | ICD-10-CM

## 2015-06-30 DIAGNOSIS — E871 Hypo-osmolality and hyponatremia: Secondary | ICD-10-CM

## 2015-06-30 NOTE — Progress Notes (Signed)
Quick Note:  Please call and advise the patient that the recent EMG and nerve conduction velocity test, which is the electrical nerve and muscle test we we performed, was reported as within normal limits. We checked for abnormal electrical discharges in the muscles or nerves and the report suggested normal findings, which is reassuring. No further action is required on this test at this time.   Huston Foley, MD, PhD   ______

## 2015-06-30 NOTE — Progress Notes (Signed)
Please refer to EMG and nerve conduction study procedure note. 

## 2015-06-30 NOTE — Procedures (Signed)
     HISTORY:  Vickie Roberts is an 81 year old patient with a five-month history of discomfort in the right buttock and down the right leg associated with weightbearing. The patient feels better with sitting or lying down. She is being evaluated for possible neuropathy or radiculopathy.  NERVE CONDUCTION STUDIES:  Nerve conduction studies were performed on both lower extremities. The distal motor latencies and motor amplitudes for the peroneal and posterior tibial nerves were within normal limits. The nerve conduction velocities for these nerves were also normal. The H reflex latencies were normal. The sensory latencies for the peroneal nerves were within normal limits.   EMG STUDIES:  EMG study was performed on the right lower extremity:  The tibialis anterior muscle reveals 2 to 4K motor units with full recruitment. No fibrillations or positive waves were seen. The peroneus tertius muscle reveals 2 to 4K motor units with full recruitment. No fibrillations or positive waves were seen. The medial gastrocnemius muscle reveals 1 to 3K motor units with full recruitment. No fibrillations or positive waves were seen. The vastus lateralis muscle reveals 2 to 4K motor units with full recruitment. No fibrillations or positive waves were seen. The iliopsoas muscle reveals 2 to 4K motor units with full recruitment. No fibrillations or positive waves were seen. The biceps femoris muscle (long head) reveals 2 to 4K motor units with full recruitment. No fibrillations or positive waves were seen. The lumbosacral paraspinal muscles were tested at 3 levels, and revealed no abnormalities of insertional activity at all 3 levels tested. There was good relaxation.   IMPRESSION:  Nerve conduction studies done on both lower extremities were unremarkable, without evidence of a peripheral neuropathy. EMG evaluation of the right lower extremity was unremarkable, no evidence of an overlying lumbosacral radiculopathy  is seen on this evaluation.  Marlan Palau MD 06/30/2015 1:45 PM  [facility] [address] [place], [place] [address]  Phone [phone] Fax [phone]

## 2015-07-01 ENCOUNTER — Telehealth: Payer: Self-pay | Admitting: Neurology

## 2015-07-01 NOTE — Telephone Encounter (Signed)
-----   Message from Huston Foley, MD sent at 06/30/2015  2:00 PM EDT ----- Please call and advise the patient that the recent EMG and nerve conduction velocity test, which is the electrical nerve and muscle test we we performed, was reported as within normal limits. We checked for abnormal electrical discharges in the muscles or nerves and the report suggested normal findings, which is reassuring. No further action is required on this test at this time.   Huston Foley, MD, PhD

## 2015-07-01 NOTE — Telephone Encounter (Signed)
I spoke to patient and she is aware of recommendations. She will call back if any more trouble.

## 2015-07-01 NOTE — Telephone Encounter (Signed)
Patient is aware of EMG/NCV results. She has called in to say that she is experiencing a lot of pain in the locations that the needles were placed. Rickayla is asking for [place] to give her something short term to help with the pain. She states that Tylenol and Ibuprofen do not help. Reports that she cannot take Tramadol and Hydrocodone. I asked her if there is anything in the past that has helped her and she said she said that she doesn't know.

## 2015-07-01 NOTE — Telephone Encounter (Signed)
I am sorry to hear she has residual discomfort, but I cannot think of anything else, maybe something cooling, an over the counter skin soothing cream with aloe vera might help. Some people however, find comfort with local heat. Maybe a mild heat pad for a few minutes.  Otherwise, no bright ideas.

## 2015-07-01 NOTE — Telephone Encounter (Signed)
Patient is calling. She had a NCV/EMG yesterday and today it still feels like the needles are sticking in her and she is in a lot of pain. Please call to discuss. Thank you.

## 2015-09-16 ENCOUNTER — Ambulatory Visit: Payer: Self-pay | Admitting: Neurology

## 2017-02-13 ENCOUNTER — Other Ambulatory Visit: Payer: Self-pay | Admitting: Orthopedic Surgery

## 2017-02-13 DIAGNOSIS — M542 Cervicalgia: Secondary | ICD-10-CM

## 2017-02-23 ENCOUNTER — Ambulatory Visit
Admission: RE | Admit: 2017-02-23 | Discharge: 2017-02-23 | Disposition: A | Discharge status: Home / Self Care | Payer: Medicare Other | Source: Ambulatory Visit | Attending: Orthopedic Surgery | Admitting: Orthopedic Surgery

## 2017-02-23 DIAGNOSIS — M542 Cervicalgia: Secondary | ICD-10-CM

## 2018-07-23 ENCOUNTER — Other Ambulatory Visit: Payer: Self-pay | Admitting: Orthopedic Surgery

## 2018-07-23 DIAGNOSIS — M5442 Lumbago with sciatica, left side: Secondary | ICD-10-CM

## 2018-07-23 DIAGNOSIS — M5440 Lumbago with sciatica, unspecified side: Secondary | ICD-10-CM

## 2018-07-30 ENCOUNTER — Ambulatory Visit
Admission: RE | Admit: 2018-07-30 | Discharge: 2018-07-30 | Disposition: A | Discharge status: Home / Self Care | Payer: Medicare Other | Source: Ambulatory Visit | Attending: Orthopedic Surgery | Admitting: Orthopedic Surgery

## 2018-07-30 DIAGNOSIS — M5442 Lumbago with sciatica, left side: Secondary | ICD-10-CM

## 2018-07-30 DIAGNOSIS — M5440 Lumbago with sciatica, unspecified side: Secondary | ICD-10-CM

## 2018-08-07 ENCOUNTER — Encounter: Payer: Self-pay | Admitting: *Deleted

## 2018-08-24 ENCOUNTER — Other Ambulatory Visit: Payer: Self-pay | Admitting: Psychiatry

## 2018-10-01 ENCOUNTER — Ambulatory Visit (INDEPENDENT_AMBULATORY_CARE_PROVIDER_SITE_OTHER): Payer: Medicare Other | Admitting: Neurology

## 2018-10-01 ENCOUNTER — Encounter: Payer: Self-pay | Admitting: Neurology

## 2018-10-01 VITALS — BP 161/85 | HR 94 | Ht 64.0 in | Wt 133.0 lb

## 2018-10-01 DIAGNOSIS — M4802 Spinal stenosis, cervical region: Secondary | ICD-10-CM

## 2018-10-01 DIAGNOSIS — M5136 Other intervertebral disc degeneration, lumbar region: Secondary | ICD-10-CM

## 2018-10-01 NOTE — Patient Instructions (Signed)
We can consider doing an EMG and nerve conduction velocity test, which is an electrical nerve and muscle test, for the upper extremities. I would suggest follow up with your spine specialist.  You do have neck degenerative changes and lumbar spine degenerative changes. I would recommend follow up with your spine specialist. You can follow up with me as needed.

## 2018-10-01 NOTE — Progress Notes (Signed)
Subjective:    Patient ID: Vickie Roberts is a 84 y.o. female.  HPI     History:  Dear Elease Hashimoto,    I saw your patient, Vickie Roberts, upon your kind request in my neurologic clinic today for initial consultation of her lower back pain radiating to the left leg. The patient is accompanied by her husband today. As you know, Vickie Roberts is an 84 year old right-handed woman with an underlying medical history of arthritis, depression, reflux disease, hypothyroidism, hyperlipidemia, back pain, depression, irritable bowel syndrome, history of MI, Sjogren's syndrome, and rheumatoid arthritis, who reports low back pain and radiating left leg pain for the past several months. She has been seeing her spine specialist. She had a lumbar spine epidural injection under Dr. Modesto Charon, which did help her pain but she has had some residual numbness affecting the left leg. She has also experienced in the past 2 and half months tingling sensation in the right arm with radiating neck pain to the right shoulder and sometimes left-sided neck pain. She has been using a single-point cane on the right side and is wondering if the pressure from using the cane has affected her right shoulder. I reviewed your office note from 09/19/2018. I have previously seen her for right leg pain over 3 years ago. She had a recent lumbar spine MRI on 07/30/2018, ordered by Dr. Luiz Blare, and I reviewed the results: IMPRESSION: 1. Multilevel degenerative disease with dextroscoliosis and L4-5, L5-S1 anterolisthesis. There has been progression from 2016. 2. L3-4 left foraminal protrusion compressing the left L3 nerve root. 3. Moderate foraminal stenosis on the left at L2-3 and right at L4-5. L4-5 right subarticular recess stenosis with L5 impingement.   She had a cervical spine MRI without contrast on 02/23/2017 and I reviewed the results: IMPRESSION: 4 x 6 mm T2 hyperintense nodule near the superior pole of the left thyroid. This could be a thyroid  nodule or a parathyroid adenoma. Small lymph node also possible.   Mild spinal stenosis at C4-5   3 mm retrolisthesis C5-6 with mild spinal stenosis. Moderate right foraminal encroachment and mild to moderate left foraminal encroachment   Mild spinal stenosis C6-7 with mild left foraminal narrowing due to spurring.  She had EMG and nerve conduction testing to the lower extremities on 06/30/2015: IMPRESSION:   Nerve conduction studies done on both lower extremities were unremarkable, without evidence of a peripheral neuropathy. EMG evaluation of the right lower extremity was unremarkable, no evidence of an overlying lumbosacral radiculopathy is seen on this evaluation.  Previously:   06/15/2015: Vickie Roberts is an 84 year old right-handed woman with an underlying medical history of depression, rheumatoid arthritis, osteoarthritis, status post carpal tunnel release, degenerative lumbar spine disease with low back pain, who reports weakness and pain of her right leg of 5 months duration. Pain is from the right gluteal region, down the lateral aspect of her right leg, into the foot. She felt symptoms started gradually and progressed but then improved slightly. She is in outpatient physical therapy. She has a muscle relaxer. She does not take this very often. She has had 2 rounds of oral stare rides and also 3 injections into her lower back. Her husband feels that she has improved some. She has no family history of muscle or nerve diseases. She saw a foot doctor and has been seeing a [facility]. You considered injections into the right knee but she declined at the time. I reviewed your office note from 05/21/2015 which you kindly included.  Of note, she presented to the emergency room on 04/14/2015 after a fall. I reviewed the emergency room records. At her most recent baseline she has been using a walker or wheelchair depending on her lower back pain level. She fell as she was reaching for her wheelchair  which was not stabilized and rolled away and she fell onto her right side and hit her head. She had no loss of consciousness. She had a rib and chest x-ray on 04/14/2015 that showed no acute abnormalities.  She had a MRI lumbar spine without contrast on 03/03/2015: Diffuse facet osteoarthritis with mild degenerative disc disease as above. No nerve impingement or explanation for radicular symptoms.  Her Past Medical History Is Significant For: Past Medical History:  Diagnosis Date  . Abnormal finding of blood chemistry   . Depression   . Osteoarthritis   . RA (rheumatoid arthritis) (HCC)     Her Past Surgical History Is Significant For: Past Surgical History:  Procedure Laterality Date  . ABDOMINAL HYSTERECTOMY    . BREAST SURGERY    . CARPAL TUNNEL RELEASE    . KNEE SURGERY      Her Family History Is Significant For: Family History  Problem Relation Age of Onset  . Heart disease Mother   . Breast cancer Mother   . Heart disease Father     Her Social History Is Significant For: Social History   Socioeconomic History  . Marital status: Married    Spouse name: Not on file  . Number of children: 4  . Years of education: HS  . Highest education level: Not on file  Occupational History  . Occupation: Retired  Engineer, production  . Financial resource strain: Not on file  . Food insecurity:    Worry: Not on file    Inability: Not on file  . Transportation needs:    Medical: Not on file    Non-medical: Not on file  Tobacco Use  . Smoking status: Never Smoker  . Smokeless tobacco: Never Used  Substance and Sexual Activity  . Alcohol use: No    Alcohol/week: 0.0 standard drinks  . Drug use: No  . Sexual activity: Yes    Birth control/protection: Post-menopausal  Lifestyle  . Physical activity:    Days per week: Not on file    Minutes per session: Not on file  . Stress: Not on file  Relationships  . Social connections:    Talks on phone: Not on file    Gets together:  Not on file    Attends religious service: Not on file    Active member of club or organization: Not on file    Attends meetings of clubs or organizations: Not on file    Relationship status: Not on file  Other Topics Concern  . Not on file  Social History Narrative   Occasional caffeine use     Her Allergies Are:  Allergies  Allergen Reactions  . Codeine Other (See Comments)  . Tramadol Other (See Comments)  . Erythromycin   . Hydrocodone   . Sulfonamide Derivatives   :   Her Current Medications Are:  Outpatient Encounter Medications as of 10/01/2018  Medication Sig  . Biotin 1 MG CAPS Take by mouth.  Marland Kitchen buPROPion (WELLBUTRIN SR) 150 MG 12 hr tablet   . cholecalciferol (VITAMIN D) 1000 UNITS tablet Take 1,000 Units by mouth daily.  . clonazePAM (KLONOPIN) 0.5 MG tablet Take 0.5 mg by mouth at bedtime.  Marland Kitchen L-Methylfolate-Algae (DEPLIN  7.5 PO) Take by mouth.  . leflunomide (ARAVA) 20 MG tablet   . predniSONE (DELTASONE) 5 MG tablet Take 5 mg by mouth daily with breakfast.  . [DISCONTINUED] methocarbamol (ROBAXIN) 500 MG tablet Take 500 mg by mouth 4 (four) times daily.  Marland Kitchen aspirin 81 MG tablet Take 81 mg by mouth daily.  . [DISCONTINUED] buPROPion (WELLBUTRIN) 100 MG tablet Take 150 mg by mouth daily.   No facility-administered encounter medications on file as of 10/01/2018.   :  Review of Systems:  Out of a complete 14 point review of systems, all are reviewed and negative with the exception of these symptoms as listed below:  Review of Systems  Neurological:       Pt presents today to discuss her right arm numbness, tingling, and tightness.    Objective:  Neurological Exam  Physical Exam Physical Examination:   Vitals:   10/01/18 1436  BP: (!) 161/85  Pulse: 94    General Examination: The patient is a very pleasant 84 y.o. female in no acute distress. She appears well-developed and well-nourished and well groomed.   HEENT: Normocephalic, atraumatic, pupils are  equal, round and reactive to light and accommodation. Corrective eyeglasses in place. Extraocular tracking is well preserved. Hearing is grossly intact.face is symmetric. No facial masking is noted. Speech is clear. Neck shows good range of motion.Oropharynx examination reveals moderate mouth dryness, otherwise nonfocal findings.  Chest: Clear to auscultation without wheezing, rhonchi or crackles noted.  Heart: S1+S2+0, regular and normal without murmurs, rubs or gallops noted.   Abdomen: Soft, non-tender and non-distended with normal bowel sounds appreciated on auscultation.  Extremities: There is no pitting edema in the distal lower extremities bilaterally.  Skin: Warm and dry without trophic changes noted.   Musculoskeletal: exam reveals arthritic changes in both hands, right more than left. She reports right shoulder pain. She also reports some discomfort in the lower back.    Neurologically:  Mental status: The patient is awake, alert and oriented in all 4 spheres. Her immediate and remote memory, attention, language skills and fund of knowledge are appropriate. There is no evidence of aphasia, agnosia, apraxia or anomia. Speech is clear with normal prosody and enunciation. Thought process is linear. Mood is normal and affect is normal.  Cranial nerves II - XII are as described above under HEENT exam. In addition: shoulder shrug is normal with equal shoulder height noted. Motor exam: Normal bulk, strength of 4+/5 and N tone is noted in both upper extremities. There is no tremor. Romberg is not tested for safety. Reflexes are 1+ in the upper extremities and trace in the knees, absent in both ankles. Fine motor skills and coordination: grossly intact.  Cerebellar testing: No dysmetria or intention tremor. There is no truncal or gait ataxia.  Sensory exam: shows mild decrease in pinprick and temperature sensation on the right forearm. Gait, station and balance: She stands with difficulty.  No veering to one side is noted. No leaning to one side is noted. Posture is Mildly stooped, could be age-appropriate. She walks slowly and cautiously, no obvious limp. She has preserved arm swing, she can walk without her cane for a few steps. Left hip height seems a little higher than right.  Assessment and Plan:   In summary, Vickie Roberts is a very pleasant 84 year old female with an underlying medical history of depression, rheumatoid arthritis, osteoarthritis, status post carpal tunnel surgery, history of degenerative lumbar spine disease with low back pain, history of  degenerative cervical spine disease with cervical spinal stenosis, who presents for evaluation of her low back pain and radiating neck pain. She has had MRI testing of the lumbar spine recently and about a year and a half ago she had a cervical spine MRI through her spine specialist. I am afraid, that there is not a whole lot I can add at this point. She is advised that we can schedule an EMG and nerve conduction test of the upper extremities in our office. She declines this at this point. She is advised to follow-up with her spine specialist as scheduled. She is advised to use her cane for gait safety. I will see her back as needed. I answered all her questions today and the patient and her husband were in agreement.   Thank you very much for allowing me to participate in the care of this nice patient. If I can be of any further assistance to you please do not hesitate to call me at [phone].  Sincerely,   Huston Foley, MD, PhD

## 2018-10-02 ENCOUNTER — Other Ambulatory Visit: Payer: Self-pay

## 2018-10-02 MED ORDER — BUPROPION HCL ER (SR) 150 MG PO TB12: 150.0000 mg | ORAL_TABLET | Freq: Every day | ORAL | 1 refills | Status: DC

## 2018-12-06 ENCOUNTER — Other Ambulatory Visit: Payer: Self-pay | Admitting: Psychiatry

## 2018-12-06 NOTE — Telephone Encounter (Signed)
Review paper chart  

## 2019-01-03 ENCOUNTER — Encounter: Payer: Self-pay | Admitting: Psychiatry

## 2019-01-03 ENCOUNTER — Ambulatory Visit (INDEPENDENT_AMBULATORY_CARE_PROVIDER_SITE_OTHER): Payer: Medicare Other | Admitting: Psychiatry

## 2019-01-03 DIAGNOSIS — F411 Generalized anxiety disorder: Secondary | ICD-10-CM

## 2019-01-03 DIAGNOSIS — F331 Major depressive disorder, recurrent, moderate: Secondary | ICD-10-CM

## 2019-01-03 NOTE — Progress Notes (Signed)
ANDERSEN FRY [id number] 03-12-1932 85 y.o.  Subjective:   Patient ID:  Vickie Roberts is a 85 y.o. (DOB 28-Aug-1932) female.  Chief Complaint:  Chief Complaint  Patient presents with  . Follow-up    Medication Management   Last seen April 2019 HPI Nyobi Herriage Crabtree presents to the office today for follow-up of depression and anxiety  Will feel better in the spring.  Gloomy winter and misses the son.  She will feel better in the Spring.  Would like more energy as is the primary concern.  Sleep pretty good.  Has never tried light therapy.  Lost appetite and weight which is better now.  Has noticed some memory problems.  Forgot how to do some things temporarily.  FHx dementia in mother and sister.   Restarted gabapentin a week or 2 ago.  Tiredness predated this.  Review of Systems:  Review of Systems  Constitutional: Positive for fatigue and unexpected weight change.       Lost 15#/8 weeks unintentionally bc difficulty eating  Musculoskeletal: Positive for arthralgias.  Neurological: Negative for tremors and weakness.  Psychiatric/Behavioral: Negative for agitation, behavioral problems, confusion, decreased concentration, dysphoric mood, hallucinations, self-injury, sleep disturbance and suicidal ideas. The patient is not nervous/anxious and is not hyperactive.     Medications: I have reviewed the patient's current medications.  Current Outpatient Medications  Medication Sig Dispense Refill  . Biotin 1 MG CAPS Take by mouth.    Marland Kitchen buPROPion (WELLBUTRIN SR) 150 MG 12 hr tablet Take 1 tablet (150 mg total) by mouth daily. 90 tablet 1  . cholecalciferol (VITAMIN D) 1000 UNITS tablet Take 1,000 Units by mouth daily.    . clonazePAM (KLONOPIN) 0.5 MG tablet TAKE 1/4 TAB IN THE MORNING AND 1/2 TAB IN THE EVENING 68 tablet 0  . gabapentin (NEURONTIN) 300 MG capsule START TAKING 1 CAP 2 TIMES DAILY FOR 2 DAYS, THEN MAY INCREASE TO 3 TIMES DAILY    . L-Methylfolate-Algae (DEPLIN 7.5 PO) Take by  mouth.    . leflunomide (ARAVA) 20 MG tablet     . predniSONE (DELTASONE) 5 MG tablet Take 5 mg by mouth daily with breakfast.    . aspirin 81 MG tablet Take 81 mg by mouth daily.     No current facility-administered medications for this visit.     Medication Side Effects: None  Allergies:  Allergies  Allergen Reactions  . Codeine Other (See Comments)  . Tramadol Other (See Comments)  . Erythromycin   . Hydrocodone   . Sulfonamide Derivatives     Past Medical History:  Diagnosis Date  . Abnormal finding of blood chemistry   . Depression   . Osteoarthritis   . RA (rheumatoid arthritis) (HCC)     Family History  Problem Relation Age of Onset  . Heart disease Mother   . Breast cancer Mother   . Heart disease Father     Social History   Socioeconomic History  . Marital status: Married    Spouse name: Not on file  . Number of children: 4  . Years of education: HS  . Highest education level: Not on file  Occupational History  . Occupation: Retired  Engineer, production  . Financial resource strain: Not on file  . Food insecurity:    Worry: Not on file    Inability: Not on file  . Transportation needs:    Medical: Not on file    Non-medical: Not on file  Tobacco Use  .  Smoking status: Never Smoker  . Smokeless tobacco: Never Used  Substance and Sexual Activity  . Alcohol use: No    Alcohol/week: 0.0 standard drinks  . Drug use: No  . Sexual activity: Yes    Birth control/protection: Post-menopausal  Lifestyle  . Physical activity:    Days per week: Not on file    Minutes per session: Not on file  . Stress: Not on file  Relationships  . Social connections:    Talks on phone: Not on file    Gets together: Not on file    Attends religious service: Not on file    Active member of club or organization: Not on file    Attends meetings of clubs or organizations: Not on file    Relationship status: Not on file  . Intimate partner violence:    Fear of current or ex  partner: Not on file    Emotionally abused: Not on file    Physically abused: Not on file    Forced sexual activity: Not on file  Other Topics Concern  . Not on file  Social History Narrative   Occasional caffeine use     Past Medical History, Surgical history, Social history, and Family history were reviewed and updated as appropriate.   Lives with [name] who's in good health.  His mother lived to 32100 yo.  [place].  Please see review of systems for further details on the patient's review from today.   Objective:   Physical Exam:  There were no vitals taken for this visit.  Physical Exam Constitutional:      General: She is not in acute distress.    Appearance: She is well-developed.  Musculoskeletal:        General: No deformity.  Neurological:     Mental Status: She is alert and oriented to person, place, and time.     Motor: No tremor.     Coordination: Coordination normal.     Gait: Gait normal.  Psychiatric:        Attention and Perception: Attention normal. She is attentive.        Mood and Affect: Mood normal. Mood is not anxious or depressed. Affect is not labile, blunt, angry or inappropriate.        Speech: Speech normal.        Behavior: Behavior normal.        Thought Content: Thought content normal. Thought content does not include homicidal or suicidal ideation. Thought content does not include homicidal or suicidal plan.        Cognition and Memory: Cognition normal.        Judgment: Judgment normal.     Comments: Insight is good.     Lab Review:     Component Value Date/Time   NA 132 (L) 01/21/2009 0954   K 4.0 01/21/2009 0954   CL 98 01/21/2009 0954   CO2 28 01/21/2009 0954   GLUCOSE 102 (H) 01/21/2009 0954   BUN 10 01/21/2009 0954   CREATININE 0.7 01/21/2009 0954   CALCIUM 9.1 01/21/2009 0954   PROT 6.3 06/15/2015 1409   ALBUMIN 3.8 01/21/2009 0954   AST 27 01/21/2009 0954   ALT 24 01/21/2009 0954   ALKPHOS 54 01/21/2009 0954    BILITOT 1.5 (H) 01/21/2009 0954   GFRNONAA 87 01/21/2009 0954   GFRAA 105 01/21/2009 0954       Component Value Date/Time   WBC 5.6 01/21/2009 0954   RBC 3.78 (L)  01/21/2009 0954   HGB 12.8 01/21/2009 0954   HCT 37.0 01/21/2009 0954   PLT 240 01/21/2009 0954   MCV 98.0 01/21/2009 0954   MCHC 34.6 01/21/2009 0954   RDW 13.3 01/21/2009 0954   MONOABS 0.6 01/21/2009 0954   EOSABS 0.0 01/21/2009 0954   BASOSABS 0.0 01/21/2009 0954    No results found for: POCLITH, LITHIUM   No results found for: PHENYTOIN, PHENOBARB, VALPROATE, CBMZ   .res Assessment: Plan:    Generalized anxiety disorder  Major depressive disorder, recurrent episode, moderate (HCC)   Consider light box for next winter.  Option low dose lithium for prevention of Alzheimer's disease.  Gave her a copy of the Dr. post article on this subject.  She will consider it.  No med changes this visit  We discussed the short-term risks associated with benzodiazepines including sedation and increased fall risk among others.  Discussed long-term side effect risk including dependence, potential withdrawal symptoms, and the potential eventual dose-related risk of dementia. She does not feel like she can go any lower and the clonazepam we have tried this in the past with failure.  She is aware of causing potentially memory problems  Overall good response to medications  Follow-up 6 months  Meredith Staggersarey Cottle, MD, DFAPA   Please see After Visit Summary for patient specific instructions.  No future appointments.  No orders of the defined types were placed in this encounter.     -------------------------------

## 2019-01-07 ENCOUNTER — Other Ambulatory Visit: Payer: Self-pay | Admitting: Psychiatry

## 2019-01-07 ENCOUNTER — Telehealth: Payer: Self-pay | Admitting: Psychiatry

## 2019-01-07 MED ORDER — LITHIUM CARBONATE 150 MG PO CAPS: 150.0000 mg | ORAL_CAPSULE | Freq: Every day | ORAL | 3 refills | Status: DC

## 2019-01-07 NOTE — Telephone Encounter (Signed)
Patient stated a discussion to start Lithium was suggested on her last visit. She has decided she is willing to try it. Please send to the [facility] on [address] in [place]

## 2019-01-08 NOTE — Telephone Encounter (Signed)
Orders submitted per provider

## 2019-01-21 ENCOUNTER — Other Ambulatory Visit: Payer: Self-pay | Admitting: Psychiatry

## 2019-01-21 ENCOUNTER — Telehealth: Payer: Self-pay | Admitting: Psychiatry

## 2019-01-21 NOTE — Telephone Encounter (Signed)
Lithium 150 mg is not available in a tablet.  Lithium 300 mg is available in a tablet but not the 150 mg size.  She can take her lithium every other day if she wants or if she prefers I could prescribe a liquid form of lithium that could be administered in a lower dose but she would have to measure that out daily to achieve that lower dose.  Obviously administering a liquid is less convenient but if she prefers I be glad to do that.

## 2019-01-21 NOTE — Telephone Encounter (Signed)
Vickie Roberts called to request new Lithium prescription.  She would like in a tablet form so she can cut it in half.  She can't do that with the capsule and they are a bit strong so wants to be able to cut in half.  [facility] to [facility]  [place].  Next appt 07/02/19

## 2019-01-22 ENCOUNTER — Other Ambulatory Visit: Payer: Self-pay | Admitting: Psychiatry

## 2019-01-22 ENCOUNTER — Telehealth: Payer: Self-pay | Admitting: Psychiatry

## 2019-01-22 MED ORDER — LITHIUM CITRATE 300 MG/5 ML PO SYRP: 100.0000 mg | Freq: Every day | ORAL | 1 refills | Status: DC

## 2019-01-22 NOTE — Telephone Encounter (Signed)
Left voice mail to call back 

## 2019-01-22 NOTE — Telephone Encounter (Signed)
Spoke with pt and she would like to try a small bottle of the lithium liquid be sent to [facility] to see if that will work for her.

## 2019-01-22 NOTE — Telephone Encounter (Signed)
Done

## 2019-01-22 NOTE — Telephone Encounter (Signed)
Patient returning your call [phone]

## 2019-03-18 ENCOUNTER — Other Ambulatory Visit: Payer: Self-pay

## 2019-03-18 MED ORDER — CLONAZEPAM 0.5 MG PO TABS: ORAL_TABLET | ORAL | 0 refills | Status: DC

## 2019-03-21 ENCOUNTER — Telehealth: Payer: Self-pay | Admitting: Psychiatry

## 2019-03-21 NOTE — Telephone Encounter (Signed)
[facility] stated never received phone refill on Clonazepam.

## 2019-03-21 NOTE — Telephone Encounter (Signed)
rx from 03/18/2019 called into pharmacy per request

## 2019-03-30 ENCOUNTER — Other Ambulatory Visit: Payer: Self-pay | Admitting: Psychiatry

## 2019-06-11 ENCOUNTER — Other Ambulatory Visit: Payer: Self-pay

## 2019-06-11 MED ORDER — CLONAZEPAM 0.5 MG PO TABS: ORAL_TABLET | ORAL | 0 refills | Status: DC

## 2019-06-14 ENCOUNTER — Other Ambulatory Visit: Payer: Self-pay

## 2019-06-14 ENCOUNTER — Telehealth: Payer: Self-pay | Admitting: Psychiatry

## 2019-06-14 MED ORDER — CLONAZEPAM 0.5 MG PO TABS: ORAL_TABLET | ORAL | 0 refills | Status: DC

## 2019-06-14 NOTE — Telephone Encounter (Signed)
Left voice mail to call back 

## 2019-06-14 NOTE — Telephone Encounter (Signed)
Pt instructed to check with her pharmacy and speak to a person not just the recording. Her klonopin rx should be there already from 06/11/2019

## 2019-06-14 NOTE — Telephone Encounter (Signed)
Pt is requesting a return phone call. RE: early med[facility] states it's urgent.

## 2019-07-02 ENCOUNTER — Encounter: Payer: Self-pay | Admitting: Psychiatry

## 2019-07-02 ENCOUNTER — Ambulatory Visit (INDEPENDENT_AMBULATORY_CARE_PROVIDER_SITE_OTHER): Payer: Medicare Other | Admitting: Psychiatry

## 2019-07-02 ENCOUNTER — Other Ambulatory Visit: Payer: Self-pay

## 2019-07-02 DIAGNOSIS — F411 Generalized anxiety disorder: Secondary | ICD-10-CM

## 2019-07-02 DIAGNOSIS — F331 Major depressive disorder, recurrent, moderate: Secondary | ICD-10-CM | POA: Diagnosis not present

## 2019-07-02 MED ORDER — MIRTAZAPINE 30 MG PO TABS: 30.0000 mg | ORAL_TABLET | Freq: Every day | ORAL | 1 refills | Status: DC

## 2019-07-02 NOTE — Progress Notes (Signed)
Vickie Roberts [id number] Jun 16, 1932 85 y.o.  Subjective:   Patient ID:  Vickie Roberts is a 85 y.o. (DOB 07/23/1932) female.  Chief Complaint:  Chief Complaint  Patient presents with  . Follow-up    Medication Management  . Anxiety    Medication Management  . Depression    Medication Management   Anxiety Symptoms include dizziness. Patient reports no confusion, decreased concentration, nervous/anxious behavior or suicidal ideas.    Depression        Associated symptoms include fatigue.  Associated symptoms include no decreased concentration and no suicidal ideas.  Past medical history includes anxiety.    Vickie Roberts presents to the office today for follow-up of depression and anxiety  Last seen February 20.  She has chronic residual anxiety.  However overall she was doing well and there were no meds changed.  I got sick and I can't do that again.  Not good.  A lot of anxiety.  Feels it as worry and somatically.  Only out of house once monthly.    Stomach px and back pain.  Both depression and anxiety worse in the morning and better after eating.  Effort to eat and drink.  Lost 20# since here DT low appetite.  Low energy.  Sleep is OK usually. H sleeps well.    [facility] ER with near syncope episode.     Has noticed some memory problems.  Forgot how to do some things temporarily.  FHx dementia in mother and sister.   Restarted gabapentin a week or 2 ago.  Tiredness predated this.  D less available bc was helping her a lot and no longer can do it bc Alvino Chapel caring for her grand baby and patient upset over this and "miss how it was".  Has not driven for months and afraid to do so.  Past Psychiatric Medication Trials: Citalopram, mirtazapine 7.5 sedation, Lexapro ID and insomnia, Pristiq nausea, Wellbutrin 300, buspirone dizzy, clonazepam, lorazepam, imipramine side effects  Review of Systems:  Review of Systems  Constitutional: Positive for fatigue and unexpected weight change.        Lost 15#/8 weeks unintentionally bc difficulty eating  Musculoskeletal: Positive for arthralgias.  Neurological: Positive for dizziness. Negative for tremors and weakness.  Psychiatric/Behavioral: Positive for depression. Negative for agitation, behavioral problems, confusion, decreased concentration, dysphoric mood, hallucinations, self-injury, sleep disturbance and suicidal ideas. The patient is not nervous/anxious and is not hyperactive.     Medications: I have reviewed the patient's current medications.  Current Outpatient Medications  Medication Sig Dispense Refill  . acetaminophen (TYLENOL) 500 MG tablet Take by mouth.    . Biotin 1 MG CAPS Take by mouth.    Marland Kitchen buPROPion (WELLBUTRIN SR) 150 MG 12 hr tablet TAKE 1 TABLET BY MOUTH EVERY DAY 90 tablet 1  . cholecalciferol (VITAMIN D) 1000 UNITS tablet Take 1,000 Units by mouth daily.    . clonazePAM (KLONOPIN) 0.5 MG tablet TAKE 1/4 TAB IN THE MORNING AND 1/2 TAB IN THE EVENING 68 tablet 0  . diphenoxylate-atropine (LOMOTIL) 2.5-0.025 MG tablet Take by mouth.    . gabapentin (NEURONTIN) 300 MG capsule START TAKING 1 CAP 2 TIMES DAILY FOR 2 DAYS, THEN MAY INCREASE TO 3 TIMES DAILY    . L-Methylfolate-Algae (DEPLIN 7.5 PO) Take by mouth.    . leflunomide (ARAVA) 20 MG tablet     . omeprazole (PRILOSEC) 20 MG capsule Take by mouth.    . ondansetron (ZOFRAN) 4 MG tablet PLEASE SEE ATTACHED  FOR DETAILED DIRECTIONS    . predniSONE (DELTASONE) 5 MG tablet Take 5 mg by mouth daily with breakfast.     No current facility-administered medications for this visit.     Medication Side Effects: None  Allergies:  Allergies  Allergen Reactions  . Codeine Other (See Comments)  . Tramadol Other (See Comments)  . Erythromycin   . Hydrocodone   . Sulfonamide Derivatives     Past Medical History:  Diagnosis Date  . Abnormal finding of blood chemistry   . Depression   . Osteoarthritis   . RA (rheumatoid arthritis) (HCC)     Family History   Problem Relation Age of Onset  . Heart disease Mother   . Breast cancer Mother   . Heart disease Father     Social History   Socioeconomic History  . Marital status: Married    Spouse name: Not on file  . Number of children: 4  . Years of education: HS  . Highest education level: Not on file  Occupational History  . Occupation: Retired  Engineer, production  . Financial resource strain: Not on file  . Food insecurity    Worry: Not on file    Inability: Not on file  . Transportation needs    Medical: Not on file    Non-medical: Not on file  Tobacco Use  . Smoking status: Never Smoker  . Smokeless tobacco: Never Used  Substance and Sexual Activity  . Alcohol use: No    Alcohol/week: 0.0 standard drinks  . Drug use: No  . Sexual activity: Yes    Birth control/protection: Post-menopausal  Lifestyle  . Physical activity    Days per week: Not on file    Minutes per session: Not on file  . Stress: Not on file  Relationships  . Social Musician on phone: Not on file    Gets together: Not on file    Attends religious service: Not on file    Active member of club or organization: Not on file    Attends meetings of clubs or organizations: Not on file    Relationship status: Not on file  . Intimate partner violence    Fear of current or ex partner: Not on file    Emotionally abused: Not on file    Physically abused: Not on file    Forced sexual activity: Not on file  Other Topics Concern  . Not on file  Social History Narrative   Occasional caffeine use     Past Medical History, Surgical history, Social history, and Family history were reviewed and updated as appropriate.   Lives with Shirlyn Goltz who's in good health.  His mother lived to 14 yo.  In [facility].  Please see review of systems for further details on the patient's review from today.   Objective:   Physical Exam:  There were no vitals taken for this visit.  Physical Exam Constitutional:       General: She is not in acute distress.    Appearance: She is well-developed.  Musculoskeletal:        General: No deformity.  Neurological:     Mental Status: She is alert and oriented to person, place, and time.     Motor: No tremor.     Coordination: Coordination normal.     Gait: Gait normal.  Psychiatric:        Attention and Perception: Attention normal. She is attentive.  Mood and Affect: Mood is anxious and depressed. Affect is not labile, blunt, angry or inappropriate.        Speech: Speech normal.        Behavior: Behavior normal.        Thought Content: Thought content normal. Thought content does not include homicidal or suicidal ideation. Thought content does not include homicidal or suicidal plan.        Cognition and Memory: Cognition normal.        Judgment: Judgment normal.     Comments: Insight is good. Sad.  Uses cane.     Lab Review:     Component Value Date/Time   NA 132 (L) 01/21/2009 0954   K 4.0 01/21/2009 0954   CL 98 01/21/2009 0954   CO2 28 01/21/2009 0954   GLUCOSE 102 (H) 01/21/2009 0954   BUN 10 01/21/2009 0954   CREATININE 0.7 01/21/2009 0954   CALCIUM 9.1 01/21/2009 0954   PROT 6.3 06/15/2015 1409   ALBUMIN 3.8 01/21/2009 0954   AST 27 01/21/2009 0954   ALT 24 01/21/2009 0954   ALKPHOS 54 01/21/2009 0954   BILITOT 1.5 (H) 01/21/2009 0954   GFRNONAA 87 01/21/2009 0954   GFRAA 105 01/21/2009 0954       Component Value Date/Time   WBC 5.6 01/21/2009 0954   RBC 3.78 (L) 01/21/2009 0954   HGB 12.8 01/21/2009 0954   HCT 37.0 01/21/2009 0954   PLT 240 01/21/2009 0954   MCV 98.0 01/21/2009 0954   MCHC 34.6 01/21/2009 0954   RDW 13.3 01/21/2009 0954   MONOABS 0.6 01/21/2009 0954   EOSABS 0.0 01/21/2009 0954   BASOSABS 0.0 01/21/2009 0954    No results found for: POCLITH, LITHIUM   No results found for: PHENYTOIN, PHENOBARB, VALPROATE, CBMZ   .res Assessment: Plan:    Panagiota was seen today for follow-up, anxiety and  depression.  Diagnoses and all orders for this visit:  Generalized anxiety disorder  Major depressive disorder, recurrent episode, moderate (HCC)  Consider light box for next winter.  Option low dose lithium for prevention of Alzheimer's disease.  Gave her a copy of the Dr. post article on this subject.  She will consider it.  Relapse of depression and anxiety with uncertain trigger.  Perhaps related to isolation from D. Options retry higher dose mirtazapine at 30 bc sedated with low dose.  Option sertraline or paroxetine bc good antianxiety effects. Abilify? Complication bc history of med sensitivity. For ease of use trial mirtazapine 30 mg nightly. Should help appetite, depression and anxiety. No other med changes.  We discussed the short-term risks associated with benzodiazepines including sedation and increased fall risk among others.  Discussed long-term side effect risk including dependence, potential withdrawal symptoms, and the potential eventual dose-related risk of dementia. She does not feel like she can go any lower and the clonazepam we have tried this in the past with failure.  She is aware of causing potentially memory problems  Overall good response to medications  Follow-up 1 months  Lynder Parents, MD, DFAPA  Please see After Visit Summary for patient specific instructions. Start mirtazapine 30 mg 1 tablet each night.  Do not cut it.   Be patient with any drowsiness in the first week, it should resolve by 5-7 days. It will help depression, anxiety and appetite.  Lynder Parents, MD, DFAPA   No future appointments.  No orders of the defined types were placed in this encounter.     -------------------------------

## 2019-07-02 NOTE — Patient Instructions (Signed)
Start mirtazapine 30 mg 1 tablet each night.  Do not cut it.   Be patient with any drowsiness in the first week, it should resolve by 5-7 days. It will help depression, anxiety and appetite.

## 2019-07-24 ENCOUNTER — Other Ambulatory Visit: Payer: Self-pay | Admitting: Psychiatry

## 2019-07-24 DIAGNOSIS — F411 Generalized anxiety disorder: Secondary | ICD-10-CM

## 2019-07-24 DIAGNOSIS — F331 Major depressive disorder, recurrent, moderate: Secondary | ICD-10-CM

## 2019-08-08 ENCOUNTER — Ambulatory Visit (INDEPENDENT_AMBULATORY_CARE_PROVIDER_SITE_OTHER): Payer: Medicare Other | Admitting: Psychiatry

## 2019-08-08 ENCOUNTER — Encounter: Payer: Self-pay | Admitting: Psychiatry

## 2019-08-08 ENCOUNTER — Other Ambulatory Visit: Payer: Self-pay

## 2019-08-08 DIAGNOSIS — F5105 Insomnia due to other mental disorder: Secondary | ICD-10-CM

## 2019-08-08 DIAGNOSIS — F411 Generalized anxiety disorder: Secondary | ICD-10-CM | POA: Diagnosis not present

## 2019-08-08 DIAGNOSIS — F331 Major depressive disorder, recurrent, moderate: Secondary | ICD-10-CM

## 2019-08-08 NOTE — Progress Notes (Signed)
Vickie Roberts [id number] August 10, 1932 85 y.o.  Subjective:   Patient ID:  Vickie Roberts is a 85 y.o. (DOB 1932-09-20) female.  Chief Complaint:  Chief Complaint  Patient presents with  . Follow-up    Medication Management and med change  . Anxiety    Medication Management  . Depression    Medication Management   Anxiety Patient reports no confusion, decreased concentration, dizziness, nervous/anxious behavior or suicidal ideas.    Depression        Associated symptoms include no decreased concentration, no fatigue and no suicidal ideas.  Past medical history includes anxiety.    Vickie Roberts presents to the office today for follow-up of depression and anxiety  Last seen August and added mirtazapine 30 to help depression, anxiety and sleep.  "It's a miracle"  Bc she can sleep.  Never was a good sleeper.  Disc SE in detail.  Initially sleepy in the morning but better now.  At least 6-7 hours of sleep.  Usually will lie down daytime.  Less anxious and depressed also and pleased.  Stomach pain is better.  Has to make herself eat.  Regained 1 #.  Not losing.  Energy is better.  No further near syncope episode.     Has noticed some memory problems.  Forgot how to do some things temporarily.  FHx dementia in mother and sister.   Restarted gabapentin a week or 2 ago.  Tiredness predated this.  D less available bc was helping her a lot and no longer can do it bc Dorian Pod caring for her grand baby and patient upset over this and "miss how it was".  Has not driven for months and afraid to do so.  Past Psychiatric Medication Trials: Citalopram, mirtazapine 7.5 sedation, Lexapro ID and insomnia, Pristiq nausea, Wellbutrin 300, buspirone dizzy, clonazepam, lorazepam, imipramine side effects  Review of Systems:  Review of Systems  Constitutional: Negative for fatigue and unexpected weight change.       Lost 15#/8 weeks unintentionally bc difficulty eating  Musculoskeletal: Positive for  arthralgias.  Neurological: Negative for dizziness, tremors and weakness.  Psychiatric/Behavioral: Positive for depression. Negative for agitation, behavioral problems, confusion, decreased concentration, dysphoric mood, hallucinations, self-injury, sleep disturbance and suicidal ideas. The patient is not nervous/anxious and is not hyperactive.     Medications: I have reviewed the patient's current medications.  Current Outpatient Medications  Medication Sig Dispense Refill  . acetaminophen (TYLENOL) 500 MG tablet Take by mouth.    . Biotin 1 MG CAPS Take by mouth.    Marland Kitchen buPROPion (WELLBUTRIN SR) 150 MG 12 hr tablet TAKE 1 TABLET BY MOUTH EVERY DAY 90 tablet 1  . cholecalciferol (VITAMIN D) 1000 UNITS tablet Take 1,000 Units by mouth daily.    . clonazePAM (KLONOPIN) 0.5 MG tablet TAKE 1/4 TAB IN THE MORNING AND 1/2 TAB IN THE EVENING 68 tablet 0  . diphenoxylate-atropine (LOMOTIL) 2.5-0.025 MG tablet Take by mouth.    Marland Kitchen L-Methylfolate-Algae (DEPLIN 7.5 PO) Take by mouth.    . leflunomide (ARAVA) 20 MG tablet     . mirtazapine (REMERON) 30 MG tablet TAKE 1 TABLET (30 MG TOTAL) BY MOUTH AT BEDTIME. 90 tablet 1  . omeprazole (PRILOSEC) 20 MG capsule Take by mouth.    . ondansetron (ZOFRAN) 4 MG tablet PLEASE SEE ATTACHED FOR DETAILED DIRECTIONS    . predniSONE (DELTASONE) 5 MG tablet Take 5 mg by mouth daily with breakfast.    . gabapentin (NEURONTIN) 300 MG capsule  START TAKING 1 CAP 2 TIMES DAILY FOR 2 DAYS, THEN MAY INCREASE TO 3 TIMES DAILY     No current facility-administered medications for this visit.     Medication Side Effects: None  Allergies:  Allergies  Allergen Reactions  . Codeine Other (See Comments)  . Tramadol Other (See Comments)  . Erythromycin   . Hydrocodone   . Sulfonamide Derivatives     Past Medical History:  Diagnosis Date  . Abnormal finding of blood chemistry   . Depression   . Osteoarthritis   . RA (rheumatoid arthritis) (HCC)     Family History   Problem Relation Age of Onset  . Heart disease Mother   . Breast cancer Mother   . Heart disease Father     Social History   Socioeconomic History  . Marital status: Married    Spouse name: Not on file  . Number of children: 4  . Years of education: HS  . Highest education level: Not on file  Occupational History  . Occupation: Retired  Engineer, productionocial Needs  . Financial resource strain: Not on file  . Food insecurity    Worry: Not on file    Inability: Not on file  . Transportation needs    Medical: Not on file    Non-medical: Not on file  Tobacco Use  . Smoking status: Never Smoker  . Smokeless tobacco: Never Used  Substance and Sexual Activity  . Alcohol use: No    Alcohol/week: 0.0 standard drinks  . Drug use: No  . Sexual activity: Yes    Birth control/protection: Post-menopausal  Lifestyle  . Physical activity    Days per week: Not on file    Minutes per session: Not on file  . Stress: Not on file  Relationships  . Social Musicianconnections    Talks on phone: Not on file    Gets together: Not on file    Attends religious service: Not on file    Active member of club or organization: Not on file    Attends meetings of clubs or organizations: Not on file    Relationship status: Not on file  . Intimate partner violence    Fear of current or ex partner: Not on file    Emotionally abused: Not on file    Physically abused: Not on file    Forced sexual activity: Not on file  Other Topics Concern  . Not on file  Social History Narrative   Occasional caffeine use     Past Medical History, Surgical history, Social history, and Family history were reviewed and updated as appropriate.   Lives with Vickie Roberts who's in good health.  His mother lived to 32100 yo.  In [facility].  Please see review of systems for further details on the patient's review from today.   Objective:   Physical Exam:  There were no vitals taken for this visit.  Physical Exam Constitutional:       General: She is not in acute distress.    Appearance: She is well-developed.  Musculoskeletal:        General: No deformity.  Neurological:     Mental Status: She is alert and oriented to person, place, and time.     Motor: No tremor.     Coordination: Coordination normal.     Gait: Gait normal.  Psychiatric:        Attention and Perception: Attention normal. She is attentive.        Mood  and Affect: Mood is anxious. Mood is not depressed. Affect is not labile, blunt, angry or inappropriate.        Speech: Speech normal.        Behavior: Behavior normal.        Thought Content: Thought content normal. Thought content does not include homicidal or suicidal ideation. Thought content does not include homicidal or suicidal plan.        Cognition and Memory: Cognition normal. She exhibits impaired recent memory.        Judgment: Judgment normal.     Comments: Insight is good. Not sad and no cane.  Clearly better.     Lab Review:     Component Value Date/Time   NA 132 (L) 01/21/2009 0954   K 4.0 01/21/2009 0954   CL 98 01/21/2009 0954   CO2 28 01/21/2009 0954   GLUCOSE 102 (H) 01/21/2009 0954   BUN 10 01/21/2009 0954   CREATININE 0.7 01/21/2009 0954   CALCIUM 9.1 01/21/2009 0954   PROT 6.3 06/15/2015 1409   ALBUMIN 3.8 01/21/2009 0954   AST 27 01/21/2009 0954   ALT 24 01/21/2009 0954   ALKPHOS 54 01/21/2009 0954   BILITOT 1.5 (H) 01/21/2009 0954   GFRNONAA 87 01/21/2009 0954   GFRAA 105 01/21/2009 0954       Component Value Date/Time   WBC 5.6 01/21/2009 0954   RBC 3.78 (L) 01/21/2009 0954   HGB 12.8 01/21/2009 0954   HCT 37.0 01/21/2009 0954   PLT 240 01/21/2009 0954   MCV 98.0 01/21/2009 0954   MCHC 34.6 01/21/2009 0954   RDW 13.3 01/21/2009 0954   MONOABS 0.6 01/21/2009 0954   EOSABS 0.0 01/21/2009 0954   BASOSABS 0.0 01/21/2009 0954    No results found for: POCLITH, LITHIUM   No results found for: PHENYTOIN, PHENOBARB, VALPROATE, CBMZ   .res Assessment:  Plan:    Robyn was seen today for follow-up, anxiety and depression.  Diagnoses and all orders for this visit:  Major depressive disorder, recurrent episode, moderate (HCC)  Generalized anxiety disorder  Insomnia due to mental condition     Consider light box for next winter.  Option low dose lithium for prevention of Alzheimer's disease.  Gave her a copy of the Dr. post article on this subject.  She will consider it.  Relapse of depression and anxiety with uncertain trigger but resolved with mirtazapine 30 mg. Continue mirtazapine 30 mg nightly. Should help appetite, depression and anxiety. No other med changes. Also continue Wellbutrin SR 150 mg every morning Clonazepam 0.5 mg tablet 1/4 tablet in the morning and 1/2 tablet nightly Gabapentin 300 mg 3 times daily Deplin 7.5 mg daily  We discussed the short-term risks associated with benzodiazepines including sedation and increased fall risk among others.  Discussed long-term side effect risk including dependence, potential withdrawal symptoms, and the potential eventual dose-related risk of dementia.  This relationship has been called into question by to recent studies and this was discussed.  She does have mild cognitive impairment but is functional and it is not much worse than would be expected for an 85 year old individual. She does not feel like she can go any lower and the clonazepam we have tried this in the past with failure.  She is aware of causing potentially memory problems  Overall good response to medications  Follow-up 4 mos  Meredith Staggers, MD, DFAPA  Please see After Visit Summary for patient specific instructions. Start mirtazapine 30 mg 1 tablet each night.  Do not cut it.   Be patient with any drowsiness in the first week, it should resolve by 5-7 days. It will help depression, anxiety and appetite.  Meredith Staggers, MD, DFAPA   No future appointments.  No orders of the defined types were placed in this  encounter.     -------------------------------

## 2019-09-10 ENCOUNTER — Other Ambulatory Visit: Payer: Self-pay | Admitting: Psychiatry

## 2019-09-10 NOTE — Telephone Encounter (Signed)
Refills? Due back 11/2019

## 2019-09-21 ENCOUNTER — Other Ambulatory Visit: Payer: Self-pay | Admitting: Psychiatry

## 2019-12-11 ENCOUNTER — Other Ambulatory Visit: Payer: Self-pay

## 2019-12-11 ENCOUNTER — Encounter: Payer: Self-pay | Admitting: Psychiatry

## 2019-12-11 ENCOUNTER — Ambulatory Visit (INDEPENDENT_AMBULATORY_CARE_PROVIDER_SITE_OTHER): Payer: Medicare Other | Admitting: Psychiatry

## 2019-12-11 DIAGNOSIS — F411 Generalized anxiety disorder: Secondary | ICD-10-CM | POA: Diagnosis not present

## 2019-12-11 DIAGNOSIS — F5105 Insomnia due to other mental disorder: Secondary | ICD-10-CM

## 2019-12-11 DIAGNOSIS — F3342 Major depressive disorder, recurrent, in full remission: Secondary | ICD-10-CM | POA: Diagnosis not present

## 2019-12-11 NOTE — Progress Notes (Signed)
URA YINGLING [id number] 06-08-1932 86 y.o.  Subjective:   Patient ID:  Vickie Roberts is a 86 y.o. (DOB 04-27-1932) female.  Chief Complaint:  Chief Complaint  Patient presents with  . Follow-up    Medication Management  . Depression    Medication Management  . Anxiety    Medication Management   Anxiety Patient reports no confusion, decreased concentration, dizziness, nervous/anxious behavior or suicidal ideas.    Depression        Associated symptoms include no decreased concentration, no fatigue and no suicidal ideas.  Past medical history includes anxiety.    Vickie Roberts presents to the office today for follow-up of depression and anxiety  seen August and added mirtazapine 30 to help depression, anxiety and sleep.  "It's a miracle"  Bc she can sleep.  Never was a good sleeper.  Disc SE in detail.  Initially sleepy in the morning but better now.  At least 6-7 hours of sleep.  Usually will lie down daytime.  Less anxious and depressed also and pleased.  Stomach pain is better.  Has to make herself eat.  Regained 1 #.  Not losing.  Energy is better.  Last seen September 2020.  Her depression had resolved with the increase and addition of mirtazapine 30 mg daily.  No other med changes occurred. Continue mirtazapine 30 mg nightly. Should help appetite, depression and anxiety. Also continue Wellbutrin SR 150 mg every morning Clonazepam 0.5 mg tablet 1/4 tablet in the morning and 1/2 tablet nightly Gabapentin 300 mg 3 times daily Deplin 7.5 mg daily  Alright but very sleepy today.  Question about mirtazapine reducing to reduce sleepiness. Sleep 8 hours. Rare actual nap in daytime.   Isolation is hard.  She has stopped gabapentin without a problem. Patient reports stable mood and denies depressed or irritable moods.  Patient denies any recent difficulty with anxiety.  Patient denies difficulty with sleep initiation or maintenance. Denies appetite disturbance.  Patient reports that  energy and motivation have been good.  Patient denies any difficulty with concentration but more trouble with memory.  Patient denies any suicidal ideation.  No further near syncope episode.      Has noticed some memory problems.  Forgot how to do some things temporarily.  FHx dementia in mother and sister.   D less available bc was helping her a lot and no longer can do it bc Vickie Roberts caring for her grand baby and patient upset over this and "miss how it was".  Has not driven for months and afraid to do so.  Past Psychiatric Medication Trials: Citalopram, mirtazapine 7.5 sedation, Lexapro ID and insomnia, Pristiq nausea, Wellbutrin 300, buspirone dizzy, clonazepam, lorazepam, imipramine side effects  Gabapentin 300 TID Mirtazapine 30 benefit.  Review of Systems:  Review of Systems  Constitutional: Negative for fatigue and unexpected weight change.       Lost 15#/8 weeks unintentionally bc difficulty eating  Musculoskeletal: Positive for arthralgias.  Neurological: Negative for dizziness, tremors and weakness.  Psychiatric/Behavioral: Positive for depression. Negative for agitation, behavioral problems, confusion, decreased concentration, dysphoric mood, hallucinations, self-injury, sleep disturbance and suicidal ideas. The patient is not nervous/anxious and is not hyperactive.     Medications: I have reviewed the patient's current medications.  Current Outpatient Medications  Medication Sig Dispense Refill  . acetaminophen (TYLENOL) 500 MG tablet Take by mouth.    . Biotin 1 MG CAPS Take by mouth.    Marland Kitchen buPROPion (WELLBUTRIN SR) 150 MG 12 hr  tablet TAKE 1 TABLET BY MOUTH EVERY DAY 90 tablet 1  . cholecalciferol (VITAMIN D) 1000 UNITS tablet Take 1,000 Units by mouth daily.    . clonazePAM (KLONOPIN) 0.5 MG tablet TAKE 1/4 TAB IN THE MORNING AND 1/2 TAB IN THE EVENING 68 tablet 3  . diphenoxylate-atropine (LOMOTIL) 2.5-0.025 MG tablet Take by mouth.    . gabapentin (NEURONTIN) 300 MG  capsule START TAKING 1 CAP 2 TIMES DAILY FOR 2 DAYS, THEN MAY INCREASE TO 3 TIMES DAILY    . L-Methylfolate-Algae (DEPLIN 7.5 PO) Take by mouth.    . leflunomide (ARAVA) 20 MG tablet     . mirtazapine (REMERON) 30 MG tablet TAKE 1 TABLET (30 MG TOTAL) BY MOUTH AT BEDTIME. 90 tablet 1  . omeprazole (PRILOSEC) 20 MG capsule Take by mouth.    . ondansetron (ZOFRAN) 4 MG tablet PLEASE SEE ATTACHED FOR DETAILED DIRECTIONS    . predniSONE (DELTASONE) 5 MG tablet Take 5 mg by mouth daily with breakfast.     No current facility-administered medications for this visit.    Medication Side Effects: None  Allergies:  Allergies  Allergen Reactions  . Codeine Other (See Comments)  . Tramadol Other (See Comments)  . Erythromycin   . Hydrocodone   . Sulfonamide Derivatives     Past Medical History:  Diagnosis Date  . Abnormal finding of blood chemistry   . Depression   . Osteoarthritis   . RA (rheumatoid arthritis) (HCC)     Family History  Problem Relation Age of Onset  . Heart disease Mother   . Breast cancer Mother   . Heart disease Father     Social History   Socioeconomic History  . Marital status: Married    Spouse name: Not on file  . Number of children: 4  . Years of education: HS  . Highest education level: Not on file  Occupational History  . Occupation: Retired  Tobacco Use  . Smoking status: Never Smoker  . Smokeless tobacco: Never Used  Substance and Sexual Activity  . Alcohol use: No    Alcohol/week: 0.0 standard drinks  . Drug use: No  . Sexual activity: Yes    Birth control/protection: Post-menopausal  Other Topics Concern  . Not on file  Social History Narrative   Occasional caffeine use    Social Determinants of Health   Financial Resource Strain:   . Difficulty of Paying Living Expenses: Not on file  Food Insecurity:   . Worried About Programme researcher, broadcasting/film/video in the Last Year: Not on file  . Ran Out of Food in the Last Year: Not on file   Transportation Needs:   . Lack of Transportation (Medical): Not on file  . Lack of Transportation (Non-Medical): Not on file  Physical Activity:   . Days of Exercise per Week: Not on file  . Minutes of Exercise per Session: Not on file  Stress:   . Feeling of Stress : Not on file  Social Connections:   . Frequency of Communication with Friends and Family: Not on file  . Frequency of Social Gatherings with Friends and Family: Not on file  . Attends Religious Services: Not on file  . Active Member of Clubs or Organizations: Not on file  . Attends Banker Meetings: Not on file  . Marital Status: Not on file  Intimate Partner Violence:   . Fear of Current or Ex-Partner: Not on file  . Emotionally Abused: Not on file  .  Physically Abused: Not on file  . Sexually Abused: Not on file    Past Medical History, Surgical history, Social history, and Family history were reviewed and updated as appropriate.   Lives with Gaylene Brooks who's in good health.  His mother lived to 87 yo.  In [facility].  Please see review of systems for further details on the patient's review from today.   Objective:   Physical Exam:  There were no vitals taken for this visit.  Physical Exam Constitutional:      General: She is not in acute distress.    Appearance: She is well-developed.  Musculoskeletal:        General: No deformity.  Neurological:     Mental Status: She is alert and oriented to person, place, and time.     Motor: No tremor.     Coordination: Coordination normal.     Gait: Gait abnormal.  Psychiatric:        Attention and Perception: Attention normal. She is attentive.        Mood and Affect: Mood is not anxious or depressed. Affect is not labile, blunt, angry or inappropriate.        Speech: Speech normal.        Behavior: Behavior normal.        Thought Content: Thought content normal. Thought content does not include homicidal or suicidal ideation. Thought content does  not include homicidal or suicidal plan.        Cognition and Memory: Cognition normal. She exhibits impaired recent memory.        Judgment: Judgment normal.     Comments: Insight is good. Not sad.  Clearly better.     Lab Review:     Component Value Date/Time   NA 132 (L) 01/21/2009 0954   K 4.0 01/21/2009 0954   CL 98 01/21/2009 0954   CO2 28 01/21/2009 0954   GLUCOSE 102 (H) 01/21/2009 0954   BUN 10 01/21/2009 0954   CREATININE 0.7 01/21/2009 0954   CALCIUM 9.1 01/21/2009 0954   PROT 6.3 06/15/2015 1409   ALBUMIN 3.8 01/21/2009 0954   AST 27 01/21/2009 0954   ALT 24 01/21/2009 0954   ALKPHOS 54 01/21/2009 0954   BILITOT 1.5 (H) 01/21/2009 0954   GFRNONAA 87 01/21/2009 0954   GFRAA 105 01/21/2009 0954       Component Value Date/Time   WBC 5.6 01/21/2009 0954   RBC 3.78 (L) 01/21/2009 0954   HGB 12.8 01/21/2009 0954   HCT 37.0 01/21/2009 0954   PLT 240 01/21/2009 0954   MCV 98.0 01/21/2009 0954   MCHC 34.6 01/21/2009 0954   RDW 13.3 01/21/2009 0954   MONOABS 0.6 01/21/2009 0954   EOSABS 0.0 01/21/2009 0954   BASOSABS 0.0 01/21/2009 0954    No results found for: POCLITH, LITHIUM   No results found for: PHENYTOIN, PHENOBARB, VALPROATE, CBMZ   .res Assessment: Plan:    Meeya was seen today for follow-up, depression and anxiety.  Diagnoses and all orders for this visit:  Recurrent major depression in full remission ([place])  Generalized anxiety disorder  Insomnia due to mental condition     Greater than 50% of 30-minute face to face time with patient was spent on counseling and coordination of care. We discussed Consider light box.  She has had no problem with seasonal depression so far this year, though this is been a problem in the past.  The mirtazapine at 30 mg daily has resolved the depression  and anxiety that she was having before.  She has been successful enough that she was able to discontinue gabapentin without any problems.  She is having a little  bit of sleepiness on mirtazapine 30 mg and asks about reducing the dose.  It was again explained to her that reducing the dose would cause more sleepiness.  We could increase the dose to 45 mg which may resolve the sleepiness but again she did not want to change any meds today.  Patient has a long history of chronic anxiety and depression but has done very well on the current meds.  Originally she was on higher much higher dose benzodiazepine and she has been able to reduce the dose and maintain on this lower dose but not able to completely discontinue  Option low dose lithium for prevention of Alzheimer's disease.  Gave her a copy of the Dr. post article on this subject previously.  She does not want to change or add meds today.Marland Kitchen  She will consider it.  Relapse of depression and anxiety with uncertain trigger but resolved with mirtazapine 30 mg. Continue mirtazapine 30 mg nightly. Should help appetite, depression and anxiety. No med changes. Also continue Wellbutrin SR 150 mg every morning Clonazepam 0.5 mg tablet 1/4 tablet in the morning and 1/2 tablet nightly Deplin 7.5 mg daily  We discussed the short-term risks associated with benzodiazepines including sedation and increased fall risk among others.  Discussed long-term side effect risk including dependence, potential withdrawal symptoms, and the potential eventual dose-related risk of dementia.  This relationship has been called into question by to recent studies and this was discussed.  She does have mild cognitive impairment but is functional and it is not much worse than would be expected for an 86 year old individual. She does not feel like she can go any lower and the clonazepam we have tried this in the past with failure.  She is aware of causing potentially memory problems  Overall good response to medications  Follow-up 6 mos  Meredith Staggers, MD, DFAPA    Meredith Staggers, MD, DFAPA   Future Appointments  Date Time Provider  Department Center  06/09/2020  1:30 PM Cottle, Steva Ready., MD [facility] None    No orders of the defined types were placed in this encounter.     -------------------------------

## 2019-12-17 ENCOUNTER — Other Ambulatory Visit: Payer: Self-pay

## 2019-12-17 MED ORDER — L-METHYLFOLATE 15 MG PO TABS: 15.0000 mg | ORAL_TABLET | Freq: Every day | ORAL | 3 refills | Status: DC

## 2020-01-10 ENCOUNTER — Other Ambulatory Visit: Payer: Self-pay | Admitting: Psychiatry

## 2020-01-10 DIAGNOSIS — F331 Major depressive disorder, recurrent, moderate: Secondary | ICD-10-CM

## 2020-01-10 DIAGNOSIS — F411 Generalized anxiety disorder: Secondary | ICD-10-CM

## 2020-03-14 ENCOUNTER — Other Ambulatory Visit: Payer: Self-pay | Admitting: Psychiatry

## 2020-04-22 ENCOUNTER — Other Ambulatory Visit: Payer: Self-pay | Admitting: Psychiatry

## 2020-04-22 DIAGNOSIS — F411 Generalized anxiety disorder: Secondary | ICD-10-CM

## 2020-04-22 DIAGNOSIS — F331 Major depressive disorder, recurrent, moderate: Secondary | ICD-10-CM

## 2020-04-23 NOTE — Telephone Encounter (Signed)
Next apt 07/27

## 2020-06-09 ENCOUNTER — Ambulatory Visit (INDEPENDENT_AMBULATORY_CARE_PROVIDER_SITE_OTHER): Payer: Medicare Other | Admitting: Psychiatry

## 2020-06-09 ENCOUNTER — Other Ambulatory Visit: Payer: Self-pay

## 2020-06-09 ENCOUNTER — Encounter: Payer: Self-pay | Admitting: Psychiatry

## 2020-06-09 DIAGNOSIS — F411 Generalized anxiety disorder: Secondary | ICD-10-CM | POA: Diagnosis not present

## 2020-06-09 DIAGNOSIS — F331 Major depressive disorder, recurrent, moderate: Secondary | ICD-10-CM

## 2020-06-09 DIAGNOSIS — F3342 Major depressive disorder, recurrent, in full remission: Secondary | ICD-10-CM | POA: Diagnosis not present

## 2020-06-09 DIAGNOSIS — F5105 Insomnia due to other mental disorder: Secondary | ICD-10-CM | POA: Diagnosis not present

## 2020-06-09 MED ORDER — BUPROPION HCL ER (SR) 150 MG PO TB12: 150.0000 mg | ORAL_TABLET | Freq: Every day | ORAL | 1 refills | Status: DC

## 2020-06-09 MED ORDER — MIRTAZAPINE 30 MG PO TABS: 30.0000 mg | ORAL_TABLET | Freq: Every day | ORAL | 1 refills | Status: DC

## 2020-06-09 MED ORDER — CLONAZEPAM 0.5 MG PO TABS: ORAL_TABLET | ORAL | 5 refills | Status: DC

## 2020-06-09 NOTE — Progress Notes (Signed)
Vickie Roberts [id number] [birth year] 86 y.o.  Subjective:   Patient ID:  Vickie Roberts is a 86 y.o. (DOB Nov 13, 1932) female.  Chief Complaint:  Chief Complaint  Patient presents with  . Follow-up  . Depression  . Anxiety   Depression        Associated symptoms include no decreased concentration, no fatigue and no suicidal ideas.  Past medical history includes anxiety.   Anxiety Patient reports no confusion, decreased concentration, dizziness, nervous/anxious behavior or suicidal ideas.     Vickie Roberts presents to the office today for follow-up of depression and anxiety  seen August 2020 and added mirtazapine 30 to help depression, anxiety and sleep.  "It's a miracle"  Bc she can sleep.  Never was a good sleeper.  Disc SE in detail.  Initially sleepy in the morning but better now.  At least 6-7 hours of sleep.  Usually will lie down daytime.  Less anxious and depressed also and pleased.  Stomach pain is better.  Has to make herself eat.  Regained 1 #.  Not losing.  Energy is better.  seen September 2020.  Her depression had resolved with the increase and addition of mirtazapine 30 mg daily.  No other med changes occurred. Continue mirtazapine 30 mg nightly. Should help appetite, depression and anxiety. Also continue Wellbutrin SR 150 mg every morning Clonazepam 0.5 mg tablet 1/4 tablet in the morning and 1/2 tablet nightly Gabapentin 300 mg 3 times daily Deplin 7.5 mg daily  11/2019 appt with the following noted: Alright but very sleepy today.  Question about mirtazapine reducing to reduce sleepiness. Sleep 8 hours. Rare actual nap in daytime.   Isolation is hard.  She has stopped gabapentin without a problem. Patient reports stable mood and denies depressed or irritable moods.  Patient denies any recent difficulty with anxiety.  Patient denies difficulty with sleep initiation or maintenance. Denies appetite disturbance.  Patient reports that energy and motivation have been good.   Patient denies any difficulty with concentration but more trouble with memory.  Patient denies any suicidal ideation. No further near syncope episode.     Has noticed some memory problems.  Forgot how to do some things temporarily.  FHx dementia in mother and sister.  Plan: Relapse of depression and anxiety with uncertain trigger but resolved with mirtazapine 30 mg. Continue mirtazapine 30 mg nightly. Should help appetite, depression and anxiety. No med changes. Also continue Wellbutrin SR 150 mg every morning Clonazepam 0.5 mg tablet 1/4 tablet in the morning and 1/2 tablet nightly Deplin 7.5 mg daily  06/09/20 appt with the following noted: Vaccinated. Not very well.  H died unexpectedly 12/31/2019 massive stroke and still surprised bc he was health.  Grieving. Knee problems.  She guesses it's normal grief.  Lives alone but Alvino Chapel lives a mile away and checks on her all the time.  She's handling affairs. Pt had unhappy childhood but good marriage. Married 65 years. Memory is worse Usually sleep pretty well.    D less available bc was helping her a lot and no longer can do it bc Alvino Chapel caring for her grand baby and patient upset over this and "miss how it was".  Has not driven for months and afraid to do so.  Past Psychiatric Medication Trials: Citalopram, mirtazapine 7.5 sedation, Lexapro ID and insomnia, Pristiq nausea, Wellbutrin 300, buspirone dizzy, , imipramine side effects clonazepam, lorazepam  Gabapentin 300 TID Mirtazapine 30 benefit.  Review of Systems:  Review of Systems  Constitutional: Negative for fatigue and unexpected weight change.       Lost 15#/8 weeks unintentionally bc difficulty eating  Musculoskeletal: Positive for arthralgias and gait problem.  Neurological: Negative for dizziness, tremors and weakness.  Psychiatric/Behavioral: Positive for depression. Negative for agitation, behavioral problems, confusion, decreased concentration, dysphoric mood, hallucinations,  self-injury, sleep disturbance and suicidal ideas. The patient is not nervous/anxious and is not hyperactive.     Medications: I have reviewed the patient's current medications.  Current Outpatient Medications  Medication Sig Dispense Refill  . acetaminophen (TYLENOL) 500 MG tablet Take by mouth.    . Biotin 1 MG CAPS Take by mouth.    Marland Kitchen buPROPion (WELLBUTRIN SR) 150 MG 12 hr tablet TAKE 1 TABLET BY MOUTH EVERY DAY 90 tablet 1  . cholecalciferol (VITAMIN D) 1000 UNITS tablet Take 1,000 Units by mouth daily.    . clonazePAM (KLONOPIN) 0.5 MG tablet TAKE 1/4 TAB IN THE MORNING AND 1/2 TAB IN THE EVENING (Patient taking differently: TAKE 1/4 TAB IN THE MORNING as needed AND 1/2 TAB IN THE EVENING) 68 tablet 0  . diphenoxylate-atropine (LOMOTIL) 2.5-0.025 MG tablet Take by mouth.    . gabapentin (NEURONTIN) 300 MG capsule START TAKING 1 CAP 2 TIMES DAILY FOR 2 DAYS, THEN MAY INCREASE TO 3 TIMES DAILY    . L-Methylfolate 15 MG TABS Take 1 tablet (15 mg total) by mouth daily. 90 tablet 3  . L-Methylfolate-Algae (DEPLIN 7.5 PO) Take by mouth.    . leflunomide (ARAVA) 20 MG tablet     . mirtazapine (REMERON) 30 MG tablet TAKE 1 TABLET BY MOUTH AT BEDTIME. 90 tablet 1  . omeprazole (PRILOSEC) 20 MG capsule Take by mouth.    . ondansetron (ZOFRAN) 4 MG tablet PLEASE SEE ATTACHED FOR DETAILED DIRECTIONS    . predniSONE (DELTASONE) 5 MG tablet Take 5 mg by mouth daily with breakfast.     No current facility-administered medications for this visit.    Medication Side Effects: None  Allergies:  Allergies  Allergen Reactions  . Codeine Other (See Comments)  . Tramadol Other (See Comments)  . Erythromycin   . Hydrocodone   . Sulfonamide Derivatives     Past Medical History:  Diagnosis Date  . Abnormal finding of blood chemistry   . Depression   . Osteoarthritis   . RA (rheumatoid arthritis) (HCC)     Family History  Problem Relation Age of Onset  . Heart disease Mother   . Breast  cancer Mother   . Heart disease Father     Social History   Socioeconomic History  . Marital status: Married    Spouse name: Not on file  . Number of children: 4  . Years of education: HS  . Highest education level: Not on file  Occupational History  . Occupation: Retired  Tobacco Use  . Smoking status: Never Smoker  . Smokeless tobacco: Never Used  Substance and Sexual Activity  . Alcohol use: No    Alcohol/week: 0.0 standard drinks  . Drug use: No  . Sexual activity: Yes    Birth control/protection: Post-menopausal  Other Topics Concern  . Not on file  Social History Narrative   Occasional caffeine use    Social Determinants of Health   Financial Resource Strain:   . Difficulty of Paying Living Expenses:   Food Insecurity:   . Worried About Programme researcher, broadcasting/film/video in the Last Year:   . [employer] of Food in the Last Year:  Transportation Needs:   . Freight forwarder (Medical):   Marland Kitchen Lack of Transportation (Non-Medical):   Physical Activity:   . Days of Exercise per Week:   . Minutes of Exercise per Session:   Stress:   . Feeling of Stress :   Social Connections:   . Frequency of Communication with Friends and Family:   . Frequency of Social Gatherings with Friends and Family:   . Attends Religious Services:   . Active Member of Clubs or Organizations:   . Attends Banker Meetings:   Marland Kitchen Marital Status:   Intimate Partner Violence:   . Fear of Current or Ex-Partner:   . Emotionally Abused:   Marland Kitchen Physically Abused:   . Sexually Abused:     Past Medical History, Surgical history, Social history, and Family history were reviewed and updated as appropriate.   Lives with Shirlyn Goltz who's in good health.  His mother lived to 62 yo.  In [facility].  Please see review of systems for further details on the patient's review from today.   Objective:   Physical Exam:  There were no vitals taken for this visit.  Physical Exam Constitutional:       General: She is not in acute distress.    Appearance: She is well-developed.  Musculoskeletal:        General: No deformity.  Neurological:     Mental Status: She is alert and oriented to person, place, and time.     Motor: No tremor.     Coordination: Coordination normal.     Gait: Gait abnormal.  Psychiatric:        Attention and Perception: Attention normal. She is attentive.        Mood and Affect: Mood is not anxious or depressed. Affect is not labile, blunt, angry or inappropriate.        Speech: Speech normal.        Behavior: Behavior normal.        Thought Content: Thought content normal. Thought content does not include homicidal or suicidal ideation. Thought content does not include homicidal or suicidal plan.        Cognition and Memory: Cognition normal. She exhibits impaired recent memory.        Judgment: Judgment normal.     Comments: Insight is good. Grief.     Lab Review:     Component Value Date/Time   NA 132 (L) 01/21/2009 0954   K 4.0 01/21/2009 0954   CL 98 01/21/2009 0954   CO2 28 01/21/2009 0954   GLUCOSE 102 (H) 01/21/2009 0954   BUN 10 01/21/2009 0954   CREATININE 0.7 01/21/2009 0954   CALCIUM 9.1 01/21/2009 0954   PROT 6.3 06/15/2015 1409   ALBUMIN 3.8 01/21/2009 0954   AST 27 01/21/2009 0954   ALT 24 01/21/2009 0954   ALKPHOS 54 01/21/2009 0954   BILITOT 1.5 (H) 01/21/2009 0954   GFRNONAA 87 01/21/2009 0954   GFRAA 105 01/21/2009 0954       Component Value Date/Time   WBC 5.6 01/21/2009 0954   RBC 3.78 (L) 01/21/2009 0954   HGB 12.8 01/21/2009 0954   HCT 37.0 01/21/2009 0954   PLT 240 01/21/2009 0954   MCV 98.0 01/21/2009 0954   MCHC 34.6 01/21/2009 0954   RDW 13.3 01/21/2009 0954   MONOABS 0.6 01/21/2009 0954   EOSABS 0.0 01/21/2009 0954   BASOSABS 0.0 01/21/2009 0954    No results found for: POCLITH, LITHIUM  No results found for: PHENYTOIN, PHENOBARB, VALPROATE, CBMZ   .res Assessment: Plan:    Vickie Roberts was seen today for  follow-up, depression and anxiety.  Diagnoses and all orders for this visit:  Recurrent major depression in full remission (HCC)  Generalized anxiety disorder  Insomnia due to mental condition     Greater than 50% of 30-minute face to face time with patient was spent on counseling and coordination of care. We discussed Consider light box.  She has had no problem with seasonal depression so far this year, though this is been a problem in the past.  The mirtazapine at 30 mg daily has resolved the depression and anxiety that she was having before.  She has been successful enough that she was able to discontinue gabapentin without any problems.  She is having a little bit of sleepiness on mirtazapine 30 mg and asks about reducing the dose.  It was again explained to her that reducing the dose would cause more sleepiness.    Patient has a long history of chronic anxiety and depression but has done very well on the current meds.  Originally she was on higher much higher dose benzodiazepine and she has been able to reduce the dose and maintain on this lower dose but not able to completely discontinue  Consider donepezil for memory but she doesn't want to add meds.  Relapse of depression and anxiety with uncertain trigger but resolved with mirtazapine 30 mg. Continue mirtazapine 30 mg nightly. Should help appetite, depression and anxiety. Also continue Wellbutrin SR 150 mg every morning Clonazepam 0.5 mg tablet 1/4 tablet in the morning only if needed and 1/2 tablet nightly Deplin 7.5 mg daily No med changes.  Disc purpose of each med.  We discussed the short-term risks associated with benzodiazepines including sedation and increased fall risk among others.  Discussed long-term side effect risk including dependence, potential withdrawal symptoms, and the potential eventual dose-related risk of dementia.  This relationship has been called into question by to recent studies and this was discussed.   She does have mild cognitive impairment but is functional and it is not much worse than would be expected for an 86 year old individual. She does not feel like she can go any lower and the clonazepam we have tried this in the past with failure.  She is aware of causing potentially memory problems  Overall good response to medications  Follow-up 6 mos  Meredith Staggers, MD, DFAPA    Meredith Staggers, MD, DFAPA   No future appointments.  No orders of the defined types were placed in this encounter.     -------------------------------

## 2020-10-20 ENCOUNTER — Other Ambulatory Visit: Payer: Self-pay | Admitting: Psychiatry

## 2020-10-20 DIAGNOSIS — F3342 Major depressive disorder, recurrent, in full remission: Secondary | ICD-10-CM

## 2020-11-20 ENCOUNTER — Other Ambulatory Visit: Payer: Self-pay | Admitting: Orthopedic Surgery

## 2020-11-20 DIAGNOSIS — M5442 Lumbago with sciatica, left side: Secondary | ICD-10-CM

## 2020-12-10 ENCOUNTER — Ambulatory Visit: Payer: Medicare Other | Admitting: Psychiatry

## 2020-12-17 ENCOUNTER — Ambulatory Visit
Admission: RE | Admit: 2020-12-17 | Discharge: 2020-12-17 | Disposition: A | Discharge status: Home / Self Care | Payer: Medicare Other | Source: Ambulatory Visit | Attending: Orthopedic Surgery | Admitting: Orthopedic Surgery

## 2020-12-17 ENCOUNTER — Other Ambulatory Visit: Payer: Self-pay

## 2020-12-17 DIAGNOSIS — M5442 Lumbago with sciatica, left side: Secondary | ICD-10-CM

## 2020-12-28 ENCOUNTER — Ambulatory Visit (INDEPENDENT_AMBULATORY_CARE_PROVIDER_SITE_OTHER): Payer: Medicare Other | Admitting: Psychiatry

## 2020-12-28 ENCOUNTER — Encounter: Payer: Self-pay | Admitting: Psychiatry

## 2020-12-28 ENCOUNTER — Other Ambulatory Visit: Payer: Self-pay

## 2020-12-28 DIAGNOSIS — F331 Major depressive disorder, recurrent, moderate: Secondary | ICD-10-CM

## 2020-12-28 DIAGNOSIS — F3342 Major depressive disorder, recurrent, in full remission: Secondary | ICD-10-CM

## 2020-12-28 DIAGNOSIS — F411 Generalized anxiety disorder: Secondary | ICD-10-CM | POA: Diagnosis not present

## 2020-12-28 DIAGNOSIS — F5105 Insomnia due to other mental disorder: Secondary | ICD-10-CM | POA: Diagnosis not present

## 2020-12-28 MED ORDER — BUPROPION HCL ER (SR) 150 MG PO TB12: 150.0000 mg | ORAL_TABLET | Freq: Every day | ORAL | 1 refills | Status: DC

## 2020-12-28 MED ORDER — DONEPEZIL HCL 5 MG PO TABS
5.0000 mg | ORAL_TABLET | Freq: Every day | ORAL | 0 refills | Status: DC
Start: 2020-12-28 — End: 2021-01-21

## 2020-12-28 NOTE — Progress Notes (Signed)
BRION HEDGES [id number] 1932-10-26 87 y.o.  Subjective:   Patient ID:  Vickie Roberts is a 87 y.o. (DOB August 22, 1932) female.  Chief Complaint:  Chief Complaint  Patient presents with  . Follow-up  . Recurrent major depression in full remission (HCC)   Depression        Associated symptoms include no decreased concentration, no fatigue and no suicidal ideas.  Past medical history includes anxiety.   Anxiety Patient reports no confusion, decreased concentration, dizziness, nervous/anxious behavior or suicidal ideas.     Kaja Jackowski Ralls presents to the office today for follow-up of depression and anxiety  seen August 2020 and added mirtazapine 30 to help depression, anxiety and sleep.  "It's a miracle"  Bc she can sleep.  Never was a good sleeper.  Disc SE in detail.  Initially sleepy in the morning but better now.  At least 6-7 hours of sleep.  Usually will lie down daytime.  Less anxious and depressed also and pleased.  Stomach pain is better.  Has to make herself eat.  Regained 1 #.  Not losing.  Energy is better.  seen September 2020.  Her depression had resolved with the increase and addition of mirtazapine 30 mg daily.  No other med changes occurred. Continue mirtazapine 30 mg nightly. Should help appetite, depression and anxiety. Also continue Wellbutrin SR 150 mg every morning Clonazepam 0.5 mg tablet 1/4 tablet in the morning and 1/2 tablet nightly Gabapentin 300 mg 3 times daily Deplin 7.5 mg daily  11/2019 appt with the following noted: Alright but very sleepy today.  Question about mirtazapine reducing to reduce sleepiness. Sleep 8 hours. Rare actual nap in daytime.   Isolation is hard.  She has stopped gabapentin without a problem. Patient reports stable mood and denies depressed or irritable moods.  Patient denies any recent difficulty with anxiety.  Patient denies difficulty with sleep initiation or maintenance. Denies appetite disturbance.  Patient reports that energy and  motivation have been good.  Patient denies any difficulty with concentration but more trouble with memory.  Patient denies any suicidal ideation. No further near syncope episode.     Has noticed some memory problems.  Forgot how to do some things temporarily.  FHx dementia in mother and sister.  Plan: Relapse of depression and anxiety with uncertain trigger but resolved with mirtazapine 30 mg. Continue mirtazapine 30 mg nightly. Should help appetite, depression and anxiety. No med changes. Also continue Wellbutrin SR 150 mg every morning Clonazepam 0.5 mg tablet 1/4 tablet in the morning and 1/2 tablet nightly Deplin 7.5 mg daily  06/09/20 appt with the following noted: Vaccinated. Not very well.  H died unexpectedly 01/03/2020 massive stroke and still surprised bc he was health.  Grieving. Knee problems.  She guesses it's normal grief.  Lives alone but Alvino Chapel lives a mile away and checks on her all the time.  She's handling affairs. Pt had unhappy childhood but good marriage. Married 65 years. Memory is worse Usually sleep pretty well.   Plan: No med changes  12/28/2020 appointment with the following noted:  Seen with D 1 year anniversary of H's death.  Was married 66 years. Weepy and still grieving in the morning worse. Has been off mirtazapine for months apparently; at least for 2 mos. Appetite fine. And weight stable.  Weight gain a little. Usually sleep is OK but occ not. One of the kids spends night with her. She thinks she's been depressed some the last few months.  Not nervous and D agrees. Not taking clonazepam lately.   D less available bc was helping her a lot and no longer can do it bc Alvino Chapel caring for her grand baby and patient upset over this and "miss how it was".  Has not driven for months and afraid to do so.  Past Psychiatric Medication Trials: Citalopram, mirtazapine 7.5 sedation, Lexapro ID and insomnia, Pristiq nausea, Wellbutrin 300, buspirone dizzy, , imipramine side  effects clonazepam, lorazepam  Gabapentin 300 TID Mirtazapine 30 benefit.  Review of Systems:  Review of Systems  Constitutional: Negative for fatigue and unexpected weight change.       Lost 15#/8 weeks unintentionally bc difficulty eating  Musculoskeletal: Positive for arthralgias, back pain and gait problem.  Neurological: Negative for dizziness, tremors and weakness.  Psychiatric/Behavioral: Positive for depression and dysphoric mood. Negative for agitation, behavioral problems, confusion, decreased concentration, hallucinations, self-injury, sleep disturbance and suicidal ideas. The patient is not nervous/anxious and is not hyperactive.     Medications: I have reviewed the patient's current medications.  Current Outpatient Medications  Medication Sig Dispense Refill  . acetaminophen (TYLENOL) 500 MG tablet Take by mouth.    . Biotin 1 MG CAPS Take by mouth.    . cholecalciferol (VITAMIN D) 1000 UNITS tablet Take 1,000 Units by mouth daily.    Marland Kitchen donepezil (ARICEPT) 5 MG tablet Take 1 tablet (5 mg total) by mouth at bedtime. 30 tablet 0  . L-Methylfolate-Algae (DEPLIN 7.5 PO) Take by mouth.    . leflunomide (ARAVA) 20 MG tablet     . predniSONE (DELTASONE) 5 MG tablet Take 5 mg by mouth daily with breakfast.    . buPROPion (WELLBUTRIN SR) 150 MG 12 hr tablet Take 1 tablet (150 mg total) by mouth daily. 90 tablet 1  . diphenoxylate-atropine (LOMOTIL) 2.5-0.025 MG tablet Take by mouth. (Patient not taking: Reported on 12/28/2020)    . gabapentin (NEURONTIN) 300 MG capsule START TAKING 1 CAP 2 TIMES DAILY FOR 2 DAYS, THEN MAY INCREASE TO 3 TIMES DAILY (Patient not taking: Reported on 12/28/2020)    . L-Methylfolate 15 MG TABS Take 1 tablet (15 mg total) by mouth daily. (Patient not taking: Reported on 12/28/2020) 90 tablet 3  . mirtazapine (REMERON) 30 MG tablet Take 1 tablet (30 mg total) by mouth at bedtime. (Patient not taking: Reported on 12/28/2020) 90 tablet 1  . omeprazole (PRILOSEC)  20 MG capsule Take by mouth. (Patient not taking: Reported on 12/28/2020)    . ondansetron (ZOFRAN) 4 MG tablet PLEASE SEE ATTACHED FOR DETAILED DIRECTIONS (Patient not taking: Reported on 12/28/2020)     No current facility-administered medications for this visit.    Medication Side Effects: None  Allergies:  Allergies  Allergen Reactions  . Codeine Other (See Comments)  . Tramadol Other (See Comments)  . Erythromycin   . Hydrocodone   . Sulfonamide Derivatives     Past Medical History:  Diagnosis Date  . Abnormal finding of blood chemistry   . Depression   . Osteoarthritis   . RA (rheumatoid arthritis) (HCC)     Family History  Problem Relation Age of Onset  . Heart disease Mother   . Breast cancer Mother   . Heart disease Father     Social History   Socioeconomic History  . Marital status: Married    Spouse name: Not on file  . Number of children: 4  . Years of education: HS  . Highest education level: Not on file  Occupational  History  . Occupation: Retired  Tobacco Use  . Smoking status: Never Smoker  . Smokeless tobacco: Never Used  Substance and Sexual Activity  . Alcohol use: No    Alcohol/week: 0.0 standard drinks  . Drug use: No  . Sexual activity: Yes    Birth control/protection: Post-menopausal  Other Topics Concern  . Not on file  Social History Narrative   Occasional caffeine use    Social Determinants of Health   Financial Resource Strain: Not on file  Food Insecurity: Not on file  Transportation Needs: Not on file  Physical Activity: Not on file  Stress: Not on file  Social Connections: Not on file  Intimate Partner Violence: Not on file    Past Medical History, Surgical history, Social history, and Family history were reviewed and updated as appropriate.   Lives with Shirlyn Goltz who's in good health.  His mother lived to 78 yo.  In [facility].  Please see review of systems for further details on the patient's review from today.    Objective:   Physical Exam:  There were no vitals taken for this visit.  Physical Exam Constitutional:      General: She is not in acute distress.    Appearance: She is well-developed.  Musculoskeletal:        General: No deformity.  Neurological:     Mental Status: She is alert and oriented to person, place, and time.     Motor: No tremor.     Coordination: Coordination normal.     Gait: Gait abnormal.  Psychiatric:        Attention and Perception: Attention normal. She is attentive.        Mood and Affect: Mood is not anxious or depressed. Affect is tearful. Affect is not labile, blunt, angry or inappropriate.        Speech: Speech normal.        Behavior: Behavior normal.        Thought Content: Thought content normal. Thought content does not include homicidal or suicidal ideation. Thought content does not include homicidal or suicidal plan.        Cognition and Memory: Cognition normal. She exhibits impaired recent memory.        Judgment: Judgment normal.     Comments: Insight is good. Grief.     Lab Review:     Component Value Date/Time   NA 132 (L) 01/21/2009 0954   K 4.0 01/21/2009 0954   CL 98 01/21/2009 0954   CO2 28 01/21/2009 0954   GLUCOSE 102 (H) 01/21/2009 0954   BUN 10 01/21/2009 0954   CREATININE 0.7 01/21/2009 0954   CALCIUM 9.1 01/21/2009 0954   PROT 6.3 06/15/2015 1409   ALBUMIN 3.8 01/21/2009 0954   AST 27 01/21/2009 0954   ALT 24 01/21/2009 0954   ALKPHOS 54 01/21/2009 0954   BILITOT 1.5 (H) 01/21/2009 0954   GFRNONAA 87 01/21/2009 0954   GFRAA 105 01/21/2009 0954       Component Value Date/Time   WBC 5.6 01/21/2009 0954   RBC 3.78 (L) 01/21/2009 0954   HGB 12.8 01/21/2009 0954   HCT 37.0 01/21/2009 0954   PLT 240 01/21/2009 0954   MCV 98.0 01/21/2009 0954   MCHC 34.6 01/21/2009 0954   RDW 13.3 01/21/2009 0954   MONOABS 0.6 01/21/2009 0954   EOSABS 0.0 01/21/2009 0954   BASOSABS 0.0 01/21/2009 0954    No results found for:  POCLITH, LITHIUM   No results  found for: PHENYTOIN, PHENOBARB, VALPROATE, CBMZ   .res Assessment: Plan:    Keyri was seen today for follow-up and recurrent major depression in full remission (hcc).  Diagnoses and all orders for this visit:  Major depressive disorder, recurrent episode, moderate (HCC)  Insomnia due to mental condition  Generalized anxiety disorder  Recurrent major depression in full remission (HCC) -     buPROPion (WELLBUTRIN SR) 150 MG 12 hr tablet; Take 1 tablet (150 mg total) by mouth daily.  Other orders -     donepezil (ARICEPT) 5 MG tablet; Take 1 tablet (5 mg total) by mouth at bedtime.     Greater than 50% of 30-minute face to face time with patient was spent on counseling and coordination of care. We discussed Consider light box.  She has had no problem with seasonal depression so far this year, though this is been a problem in the past.  Still grief issues    Patient has a long history of chronic anxiety and depression but has done very well on the current meds.  Originally she was on higher much higher dose benzodiazepine and she has been able to reduce the dose and maintain on this lower dose but not able to completely discontinue  Consider donepezil for memory.  Yes.   Relapse of depression and anxiety with uncertain trigger but resolved with mirtazapine 30 mg.  Also continue Wellbutrin SR 150 mg every morning Successful stopping clonazepam except rarely. Deplin 7.5 mg daily   We discussed the short-term risks associated with benzodiazepines including sedation and increased fall risk among others.  Discussed long-term side effect risk including dependence, potential withdrawal symptoms, and the potential eventual dose-related risk of dementia.  This relationship has been called into question by to recent studies and this was discussed.  She does have mild cognitive impairment but is functional and it is not much worse than would be expected for an  87 year old individual. She does not feel like she can go any lower and the clonazepam we have tried this in the past with failure.  She is aware of causing potentially memory problems  Overall good response to medications  Follow-up 6 mos  Meredith Staggers, MD, DFAPA   No future appointments.  No orders of the defined types were placed in this encounter.     -------------------------------

## 2021-01-06 ENCOUNTER — Other Ambulatory Visit: Payer: Self-pay | Admitting: Psychiatry

## 2021-01-06 ENCOUNTER — Telehealth: Payer: Self-pay | Admitting: Psychiatry

## 2021-01-06 DIAGNOSIS — F411 Generalized anxiety disorder: Secondary | ICD-10-CM

## 2021-01-06 DIAGNOSIS — F3342 Major depressive disorder, recurrent, in full remission: Secondary | ICD-10-CM

## 2021-01-06 MED ORDER — MIRTAZAPINE 30 MG PO TABS: 30.0000 mg | ORAL_TABLET | Freq: Every day | ORAL | 0 refills | Status: DC

## 2021-01-06 NOTE — Telephone Encounter (Signed)
Patient had previously been on mirtazapine which helped her depression.  It is safer to use that then to try something like Abilify because of her age.  I sent in prescription for the mirtazapine 1 at night.

## 2021-01-06 NOTE — Telephone Encounter (Signed)
Vickie Roberts has been notified.

## 2021-01-06 NOTE — Telephone Encounter (Signed)
Her daughter wants to know is the mirtazapine ok to take with Wellbutrin

## 2021-01-06 NOTE — Telephone Encounter (Signed)
Yes.  She took Wellbutrin and mirtazapine together before with good benefit for depression.

## 2021-01-06 NOTE — Telephone Encounter (Signed)
Pt's daughter Alvino Chapel called to report Pt crying a lot today. Noticed she did not take Wellbutrin yesterday. Mentioned she has given her Clonazolam quarter of a pill. Pt has for PRN. Seems to be helping her. Alvino Chapel also, mentioned, Pt had taken Abilify in the past. Asking for advise. Contact # [phone]  Stated Pt very med sensitive.

## 2021-01-08 ENCOUNTER — Telehealth: Payer: Self-pay | Admitting: Psychiatry

## 2021-01-08 NOTE — Telephone Encounter (Signed)
Pt 's daughter Vickie Roberts left message that her mother feels that the Mirtazapine is to strong. She wants to know if she cut it in half for her or get the dose lowered. Please call [phone].

## 2021-01-08 NOTE — Telephone Encounter (Signed)
I spoke to Vickie Roberts and she Vickie Roberts will let her mother know to give it some time and will call us back in a week with a update.

## 2021-01-08 NOTE — Telephone Encounter (Signed)
Call daughter back and tell her that This is the exact same dosage that she took in the past and tolerated. Lower doses will not help depression. She is reacting because she just took it the first time. In a few days the side effects will resolve. This is an unusual medicine in that if you lower the dose it makes the medicine more sedating not less sedating. But lower doses are not effective for depression. She needs to give the medicine about 5 days to get used to it.

## 2021-01-20 ENCOUNTER — Other Ambulatory Visit: Payer: Self-pay | Admitting: Psychiatry

## 2021-01-21 ENCOUNTER — Other Ambulatory Visit: Payer: Self-pay | Admitting: Psychiatry

## 2021-01-21 NOTE — Telephone Encounter (Signed)
As planned increasing donepezil from 5 to 10 mg nightly which is the normal dose

## 2021-01-28 ENCOUNTER — Telehealth: Payer: Self-pay | Admitting: Psychiatry

## 2021-01-28 NOTE — Telephone Encounter (Signed)
Next visit is 07/28/21. Dell daughter Alvino Chapel [facility] listed on DPR) wants to know if Kathyrn can take 1 pill instead of 1/2 of Deplin and wants to know the reason she is taking the Deplin? Ellen's phone number is [phone].

## 2021-01-28 NOTE — Telephone Encounter (Signed)
Deplin is a supplement that can augment the benefit of the mirtazapine for depression and anxiety and also have some modest benefit for concentration and memory.  She can certainly increase it to 1 daily if she would like.  If it is too expensive and they would prefer to stop it they can try doing so.

## 2021-01-28 NOTE — Telephone Encounter (Signed)
Vickie Roberts informed me that [name] no longer takes the mirtazapine due to the drugged feeling it gave her the next day.She wants to know will the deplin also work with the Wellbutrin to help the depression and anxiety.If not,she would like to discuss another medication that will but in the meantime she will increase deplin to 1 a day.

## 2021-01-29 NOTE — Telephone Encounter (Signed)
He has the Deplin can work with the Wellbutrin.  I agree with increasing the Deplin to 15 mg daily.  She is extremely medication sensitive and is failed to tolerate multiple other medications so I would prefer not to start another medication if it can be avoided.  Therefore see if the Deplin will help her mood sufficiently.

## 2021-02-01 NOTE — Telephone Encounter (Signed)
Vickie Roberts has been informed.

## 2021-02-16 ENCOUNTER — Ambulatory Visit: Payer: Medicare Other | Admitting: Physical Therapy

## 2021-02-18 ENCOUNTER — Ambulatory Visit: Payer: Medicare Other

## 2021-02-23 ENCOUNTER — Ambulatory Visit: Payer: Medicare Other | Attending: Family Medicine | Admitting: Physical Therapy

## 2021-02-23 ENCOUNTER — Other Ambulatory Visit: Payer: Self-pay

## 2021-02-23 ENCOUNTER — Encounter: Payer: Self-pay | Admitting: Physical Therapy

## 2021-02-23 DIAGNOSIS — M25552 Pain in left hip: Secondary | ICD-10-CM

## 2021-02-23 DIAGNOSIS — R2689 Other abnormalities of gait and mobility: Secondary | ICD-10-CM | POA: Diagnosis present

## 2021-02-23 DIAGNOSIS — M6281 Muscle weakness (generalized): Secondary | ICD-10-CM

## 2021-02-23 DIAGNOSIS — M5442 Lumbago with sciatica, left side: Secondary | ICD-10-CM | POA: Insufficient documentation

## 2021-02-23 DIAGNOSIS — R262 Difficulty in walking, not elsewhere classified: Secondary | ICD-10-CM

## 2021-02-23 DIAGNOSIS — G8929 Other chronic pain: Secondary | ICD-10-CM

## 2021-02-23 NOTE — Therapy (Signed)
[facility] [address]  [address] [place], [place], [address] Phone: [phone]   Fax:  [phone]  Physical Therapy Evaluation  Patient Details  Name: Vickie Roberts MRN: [id number] Date of Birth: 15-Jun-1982 Referring Provider (PT): Eula Listen, MD   Encounter Date: 02/23/2021   PT End of Session - 02/23/21 1448    Visit Number 1    Number of Visits 16    Date for PT Re-Evaluation 04/20/21    Authorization Type Medicare & AARP    PT Start Time 1448    PT Stop Time 1551    PT Time Calculation (min) 63 min    Activity Tolerance Patient tolerated treatment well    Behavior During Therapy [facility] for tasks assessed/performed           Past Medical History:  Diagnosis Date  . Abnormal finding of blood chemistry   . Depression   . Osteoarthritis   . RA (rheumatoid arthritis) (HCC)     Past Surgical History:  Procedure Laterality Date  . ABDOMINAL HYSTERECTOMY    . BREAST SURGERY    . CARPAL TUNNEL RELEASE    . KNEE SURGERY      There were no vitals filed for this visit.    Subjective Assessment - 02/23/21 1454    Subjective Pt reports a problem with pain from the waist down - pain in back and hips - for several months w/o known MOI. has started using a RW d/t pain but states her dtr has ordered her a rollator.    Limitations Sitting;Standing;Walking    How long can you sit comfortably? unlimited but increased pain when she goes to get up    How long can you stand comfortably? 10 minutes    How long can you walk comfortably? 15 minutes    Diagnostic tests Lumbar MRI 12/17/20: Moderate to severe subarticular and foraminal stenosis left L3-4 without change.  Moderate to severe subarticular and foraminal stenosis on the left L3-4 unchanged.  Severe right foraminal encroachment L4-5 has progressed due to disc protrusion. Right L4 nerve root compression is noted. Mild spinal stenosis.    Patient Stated Goals "less pain  so I can walk more"    Currently in Pain? Yes    Pain Score 5     Pain Location Back    Pain Orientation Lower    Pain Descriptors / Indicators Other (Comment)   "hard"   Pain Type Chronic pain   3-6 months   Pain Radiating Towards pain into L LE, sometimes to L ankle    Pain Onset More than a month ago    Pain Frequency Constant    Aggravating Factors  transitional movements - bed mobility & sit <> stand, particularly after prolonged inactivity/rest    Pain Relieving Factors lay down    Effect of Pain on Daily Activities limits walking tolerance              [facility] PT Assessment - 02/23/21 1448      Assessment   Medical Diagnosis LBP with L LE radiculopathy & L hip greater trochanteric bursitis    Referring Provider (PT) Eula Listen, MD    Onset Date/Surgical Date --   3-6 months   Hand Dominance Right    Prior Therapy none for current problem      Precautions   Precautions None      Restrictions   Weight Bearing Restrictions No  Balance Screen   Has the patient fallen in the past 6 months No    Has the patient had a decrease in activity level because of a fear of falling?  Yes    Is the patient reluctant to leave their home because of a fear of falling?  Yes      Home Environment   Living Environment Private residence    Living Arrangements Alone    Available Help at Discharge Family    Type of Home House    Home Access Level entry    Home Layout Two level;Able to live on main level with bedroom/bathroom    Home Equipment Walker - 2 wheels;Cane - single point;Shower seat;Grab bars - toilet   dtr is ordering a rollator     Prior Function   Level of Independence Independent    Vocation Retired    Leisure walking - daily x 45 min      Cognition   Overall Cognitive Status Impaired/Different from baseline    Memory Impaired      Observation/Other Assessments   Focus on Therapeutic Outcomes (FOTO)  Lumbar: FS=42; predicted D/C FS=57       Posture/Postural Control   Posture/Postural Control Postural limitations    Postural Limitations Left pelvic obliquity;Weight shift right      ROM / Strength   AROM / PROM / Strength AROM;Strength      Strength   Overall Strength Comments tested in sitting    Strength Assessment Site Hip;Knee;Ankle    Right/Left Hip Right;Left    Right Hip Flexion 4-/5    Right Hip Extension 3/5    Right Hip External Rotation  3+/5    Right Hip Internal Rotation 3+/5    Right Hip ABduction 4-/5    Right Hip ADduction 4/5    Left Hip Flexion 4-/5    Left Hip Extension 3/5    Left Hip External Rotation 3+/5    Left Hip Internal Rotation 3+/5    Left Hip ABduction 4-/5    Left Hip ADduction 4/5    Right/Left Knee Right;Left    Right Knee Flexion 4/5    Right Knee Extension 4/5    Left Knee Flexion 4-/5    Left Knee Extension 4/5    Right/Left Ankle Right;Left    Right Ankle Dorsiflexion 4-/5    Left Ankle Dorsiflexion 4-/5      Flexibility   Soft Tissue Assessment /Muscle Length yes    Hamstrings mild tight B    ITB mild/mod tight B    Piriformis mild/mod tight B      Palpation   SI assessment  Apparent R upslip resulting in functional shorteneing (~1 inch) of R LE    Palpation comment TTP over L greater trochanter      Ambulation/Gait   Ambulation/Gait Yes    Ambulation/Gait Assistance 5: Supervision    Assistive device Rolling walker    Gait Pattern Trunk flexed;Lateral trunk lean to right;Decreased weight shift to left;Step-through pattern   decreased RW proximity   Ambulation Surface Level;Indoor    Gait Comments Pt & dtr interested in trying a rollator style 4-wheel walker                      Objective measurements completed on examination: See above findings.       [facility] Adult PT Treatment/Exercise - 02/23/21 1448      Ambulation/Gait   Ambulation/Gait Assistance Details cues for upright posture and improved  RW proximity with foot fall w/in frame of RW       Lumbar Exercises: Stretches   Passive Hamstring Stretch Right;Left;1 rep;30 seconds    Passive Hamstring Stretch Limitations seated hip hinge    Piriformis Stretch Right;Left;1 rep;30 seconds    Piriformis Stretch Limitations seated KTOS    Figure 4 Stretch 1 rep;20 seconds;Seated;With overpressure    Figure 4 Stretch Limitations side-sitting hip hinge      Manual Therapy   Manual Therapy Manual Traction    Manual Traction R LElongitudinal distraction 5 x 10" - partial correction on R pelvic upslip following longitudinal distraction resulting in reduction in leg length discrepancy      Ambulation   Ambulation/Gait Assistance Details Verbal cues for safe use of DME/AE;Tactile cues for posture                  PT Education - 02/23/21 1551    Education Details PT eval findings, anticipated POC & initial HEP - Access Code: [email or web address]    Person(s) Educated Patient;Child(ren)    Methods Explanation;Demonstration;Verbal cues;Tactile cues;Handout    Comprehension Verbalized understanding;Verbal cues required;Tactile cues required;Returned demonstration;Need further instruction            PT Short Term Goals - 02/23/21 1927      PT SHORT TERM GOAL #1   Title Patient will be independent with initial HEP    Status New    Target Date 03/16/21      PT SHORT TERM GOAL #2   Title Patient will demonstrate safe transfer technique and proper gait pattern with 4-wheel rolling walker or LRAD as indicated    Status New    Target Date 03/23/21      PT SHORT TERM GOAL #3   Title Decrease LBP and L hip pain by >/= 25% allowing patient increased ease of transitional mobility    Status New    Target Date 03/23/21      PT SHORT TERM GOAL #4   Title Patient will notice centralization of pain in L LE with exercises, transitional mobility and ADLs    Status New    Target Date 03/23/21             PT Long Term Goals - 02/23/21 1933      PT LONG TERM GOAL #1   Title Patient will be  independent with ongoing/advanced HEP for self-management at home    Status New    Target Date 04/20/21      PT LONG TERM GOAL #2   Title Decrease LBP and L hip pain by >/= 50-75% allowing patient increased ease of transitional mobilty and improved walking tolerance    Status New    Target Date 04/20/21      PT LONG TERM GOAL #3   Title Patient will demonstrate improved B LE strength to >/= 4 to 4+/5 for improved stability and ease of mobility    Status New    Target Date 04/20/21      PT LONG TERM GOAL #4   Title Patient will improve walking tolerance to >/= 30-45 minutes w/o pain interference to allow resumption of normal daily activities    Status New    Target Date 04/20/21                  Plan - 02/23/21 1551    Clinical Impression Statement Undine is an 87 y/o female referred to OP PT for L greater trochanteric bursitis. Pt c/o B LBP  extending into L hip and down L LE at times to ankle, but also notes TTP over L greater trochanter. Pt unable to recall MOI or triggering event for onset of pain and thinks pain has probably been an issue for 3-6 months. Pain limits her transitional mobility with bed mobility and transfers and has caused her to start using a RW and cut back on walking for exercise. Current deficits include B lower back pain with L LE radiculopathy, L lateral hip pain, limited proximal LE flexibility, mild to moderate proximal LE weakness, L pelvic upslip with functional shortening of L LE and postural deficits with L pelvic obliquity. Veronia will benefit from skilled PT to address above deficits to reduce myofascial pain and tightness and improve core strength to restore normal mobility and allow for increased participation in desired activities. As low back and hip pain better managed, she may also benefit from balance and gait stability assessment and treatment as indicated.    Personal Factors and Comorbidities Time since onset of  injury/illness/exacerbation;Past/Current Experience;Age;Comorbidity 3+    Comorbidities OA, RA, MI, cardiac arrhythmia, GERD, IBS, Sjogren's syndrome, hypothyroidism, HLD, depression, anxiety, PSHx: CTR, knee surgery    Examination-Activity Limitations Bed Mobility;Bend;Transfers;Locomotion Level;Sit;Stand;Toileting    Examination-Participation Restrictions Cleaning;Community Activity;Laundry;Meal Prep;Shop    Stability/Clinical Decision Making Evolving/Moderate complexity    Clinical Decision Making Moderate    Rehab Potential Good    PT Frequency 2x / week    PT Duration 6 weeks    PT Treatment/Interventions ADLs/Self Care Home Management;Cryotherapy;Electrical Stimulation;Iontophoresis /ml Dexamethasone;Moist Heat;Traction;Ultrasound;DME Instruction;Gait training;Functional mobility training;Therapeutic activities;Therapeutic exercise;Balance training;Neuromuscular re-education;Patient/family education;Manual techniques;Passive range of motion;Dry needling;Taping    PT Next Visit Plan Assess gait safety with rollator & reinforce posture/RW proximity with RW &/or rollator; lumbopelvic flexibility and strengthening - HEP update as tolerated; manual therapy & modalities PRN    PT Home Exercise Plan Access Code: [email or web address] (4/12)    Consulted and Agree with Plan of Care Patient;Family member/caregiver    Family Member Consulted Dtr Alvino Chapel           Patient will benefit from skilled therapeutic intervention in order to improve the following deficits and impairments:  Abnormal gait,Decreased activity tolerance,Decreased balance,Decreased endurance,Decreased knowledge of precautions,Decreased knowledge of use of DME,Decreased mobility,Decreased range of motion,Decreased safety awareness,Decreased strength,Difficulty walking,Increased fascial restricitons,Increased muscle spasms,Impaired perceived functional ability,Impaired flexibility,Improper body mechanics,Postural dysfunction,Pain  Visit  Diagnosis: Chronic bilateral low back pain with left-sided sciatica  Pain in left hip  Other abnormalities of gait and mobility  Difficulty in walking, not elsewhere classified  Muscle weakness (generalized)     Problem List Patient Active Problem List   Diagnosis Date Noted  . PAIN IN JOINT, MULTIPLE SITES 01/26/2009  . HYPONATREMIA 01/02/2009  . 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 LEUKOCYTOPENIA UNSPECIFIED 01/02/2009  . OTHER SYMPTOMS INVOLVING URINARY SYSTEM 12/29/2008  . GLUCOSE INTOLERANCE 11/21/2008  . MYOCARDIAL INFARCTION, HX OF 11/21/2008  . CARDIAC ARRHYTHMIA 11/21/2008  . CONSTIPATION 11/21/2008  . IRRITABLE BOWEL SYNDROME 11/21/2008  . ABDOMINAL PAIN, GENERALIZED, CHRONIC 11/21/2008  . SEIZURES, HX OF 11/21/2008  . HYPOTHYROIDISM 10/23/2008  . HYPERLIPIDEMIA 10/23/2008  . ANXIETY 10/23/2008  . DEPRESSION 10/23/2008  . GERD 10/23/2008  . CELIAC SPRUE 10/23/2008  . SJOGREN'S SYNDROME 10/23/2008  . OSTEOARTHRITIS 10/23/2008  . INSOMNIA 10/23/2008  . PERSONAL HISTORY OF MALIGNANT NEOPLASM OF BREAST 10/23/2008  . SKIN CANCER, HX OF 10/23/2008  . HEART MURMUR, HX OF 10/23/2008    Marry Guan, PT, MPT 02/23/2021, 7:41 PM  [facility] [address]  [address] [place], [place], [address] Phone: [phone]   Fax:  [phone]  Name: JALYSSA FLEISHER MRN: [id number] Date of Birth: 1932/03/05

## 2021-02-23 NOTE — Patient Instructions (Signed)
    Access Code: [email or web address] URL: [email or web address] Date: 02/23/2021 Prepared by: Glenetta Hew  Exercises Seated Hamstring Stretch - 2 x daily - 7 x weekly - 3 reps - 20-30 sec hold Seated Piriformis Stretch - 2 x daily - 7 x weekly - 3 reps - 20-30 hold

## 2021-02-25 ENCOUNTER — Other Ambulatory Visit: Payer: Self-pay

## 2021-02-25 ENCOUNTER — Ambulatory Visit: Payer: Medicare Other

## 2021-02-25 DIAGNOSIS — R262 Difficulty in walking, not elsewhere classified: Secondary | ICD-10-CM

## 2021-02-25 DIAGNOSIS — M5442 Lumbago with sciatica, left side: Secondary | ICD-10-CM | POA: Diagnosis not present

## 2021-02-25 DIAGNOSIS — M25552 Pain in left hip: Secondary | ICD-10-CM

## 2021-02-25 DIAGNOSIS — R2689 Other abnormalities of gait and mobility: Secondary | ICD-10-CM

## 2021-02-25 DIAGNOSIS — G8929 Other chronic pain: Secondary | ICD-10-CM

## 2021-02-25 DIAGNOSIS — M6281 Muscle weakness (generalized): Secondary | ICD-10-CM

## 2021-02-25 NOTE — Therapy (Addendum)
[facility] [address]  [place], [place], [address] Phone: [phone]   Fax:  [phone]  Physical Therapy Treatment / Discharge Summary  Patient Details  Name: Vickie Roberts MRN: [id number] Date of Birth: 1932/05/10 Referring Provider (PT): Rhina Brackett, MD   Encounter Date: 02/25/2021   PT End of Session - 02/25/21 1438    Visit Number 2    Number of Visits 16    Date for PT Re-Evaluation 04/20/21    Authorization Type Medicare & AARP    PT Start Time 1405    PT Stop Time 1425    PT Time Calculation (min) 20 min    Activity Tolerance Patient tolerated treatment well    Behavior During Therapy [facility] for tasks assessed/performed           Past Medical History:  Diagnosis Date  . Abnormal finding of blood chemistry   . Depression   . Osteoarthritis   . RA (rheumatoid arthritis) ([place])     Past Surgical History:  Procedure Laterality Date  . ABDOMINAL HYSTERECTOMY    . BREAST SURGERY    . CARPAL TUNNEL RELEASE    . KNEE SURGERY      There were no vitals filed for this visit.   Subjective Assessment - 02/25/21 1409    Subjective Pt reporting a lot of fatigue today, thinks she didn't sleep too well last night.    Diagnostic tests Lumbar MRI 12/17/20: Moderate to severe subarticular and foraminal stenosis left L3-4 without change.  Moderate to severe subarticular and foraminal stenosis on the left L3-4 unchanged.  Severe right foraminal encroachment L4-5 has progressed due to disc protrusion. Right L4 nerve root compression is noted. Mild spinal stenosis.    Patient Stated Goals "less pain so I can walk more"    Currently in Pain? No/denies                             [facility] Adult PT Treatment/Exercise - 02/25/21 0001      Ambulation/Gait   Ambulation/Gait Yes    Ambulation/Gait Assistance 5: Supervision    Assistive device Rolling walker    Gait Pattern Trunk flexed;Lateral  trunk lean to right;Decreased weight shift to left;Step-through pattern    Ambulation Surface Level;Indoor    Gait Comments cues needed from proper navigation of walker instructed her on keep upright posture, LEs inside of RW and safe turns                    PT Short Term Goals - 02/25/21 1834      PT SHORT TERM GOAL #1   Title Patient will be independent with initial HEP    Status On-going    Target Date 03/16/21      PT SHORT TERM GOAL #2   Title Patient will demonstrate safe transfer technique and proper gait pattern with 4-wheel rolling walker or LRAD as indicated    Status On-going    Target Date 03/23/21      PT SHORT TERM GOAL #3   Title Decrease LBP and L hip pain by >/= 25% allowing patient increased ease of transitional mobility    Status On-going    Target Date 03/23/21      PT SHORT TERM GOAL #4   Title Patient will notice centralization of pain in L LE with exercises, transitional mobility and ADLs    Status  On-going    Target Date 03/23/21             PT Long Term Goals - 02/25/21 1835      PT LONG TERM GOAL #1   Title Patient will be independent with ongoing/advanced HEP for self-management at home    Status On-going      PT LONG TERM GOAL #2   Title Decrease LBP and L hip pain by >/= 50-75% allowing patient increased ease of transitional mobilty and improved walking tolerance    Status On-going      PT LONG TERM GOAL #3   Title Patient will demonstrate improved B LE strength to >/= 4 to 4+/5 for improved stability and ease of mobility    Status On-going      PT LONG TERM GOAL #4   Title Patient will improve walking tolerance to >/= 30-45 minutes w/o pain interference to allow resumption of normal daily activities    Status On-going                 Plan - 02/25/21 1443    Clinical Impression Statement Pt came into ambulating with RW showing decreased safety awareness with AD and decreased proximity from AD. She was extremely  fatigued before treatment but wanted to attempt therapy. Tried gait training with RW, pt needed constant reminders to keep close proximity to walker and to pivot safely keeping all four wheels on the ground. Although pt was mostly limited by fatigue prior to session, she does not appear functionally prepared to transition to rollator, pt requires further instruction on gait training with RW to normalize her gait pattern and increase her safety with AD. She showed decreased tolerance for ambulation d/t fatigue with prolonged seated breaks and had to end treatment early d/t weakness.    Personal Factors and Comorbidities Time since onset of injury/illness/exacerbation;Past/Current Experience;Age;Comorbidity 3+    Comorbidities OA, RA, MI, cardiac arrhythmia, GERD, IBS, Sjogren's syndrome, hypothyroidism, HLD, depression, anxiety, PSHx: CTR, knee surgery    PT Frequency 2x / week    PT Duration 6 weeks    PT Treatment/Interventions ADLs/Self Care Home Management;Cryotherapy;Electrical Stimulation;Iontophoresis 4mg /ml Dexamethasone;Moist Heat;Traction;Ultrasound;DME Instruction;Gait training;Functional mobility training;Therapeutic activities;Therapeutic exercise;Balance training;Neuromuscular re-education;Patient/family education;Manual techniques;Passive range of motion;Dry needling;Taping    PT Next Visit Plan Assess gait safety with rollator & reinforce posture/RW proximity with RW &/or rollator; lumbopelvic flexibility and strengthening - HEP update as tolerated; manual therapy & modalities PRN    PT Home Exercise Plan Access Code: [email or web address] (4/12)    Consulted and Agree with Plan of Care Patient;Family member/caregiver    Family Member Consulted Dtr Dorian Pod           Patient will benefit from skilled therapeutic intervention in order to improve the following deficits and impairments:  Abnormal gait,Decreased activity tolerance,Decreased balance,Decreased endurance,Decreased knowledge of  precautions,Decreased knowledge of use of DME,Decreased mobility,Decreased range of motion,Decreased safety awareness,Decreased strength,Difficulty walking,Increased fascial restricitons,Increased muscle spasms,Impaired perceived functional ability,Impaired flexibility,Improper body mechanics,Postural dysfunction,Pain  Visit Diagnosis: Chronic bilateral low back pain with left-sided sciatica  Pain in left hip  Other abnormalities of gait and mobility  Difficulty in walking, not elsewhere classified  Muscle weakness (generalized)     Problem List Patient Active Problem List   Diagnosis Date Noted  . PAIN IN JOINT, MULTIPLE SITES 01/26/2009  . HYPONATREMIA 01/02/2009  . [place] SYNDROME 01/02/2009  . LEUKOCYTOPENIA UNSPECIFIED 01/02/2009  . OTHER SYMPTOMS INVOLVING URINARY SYSTEM 12/29/2008  . GLUCOSE INTOLERANCE 11/21/2008  . MYOCARDIAL INFARCTION,  HX OF 11/21/2008  . CARDIAC ARRHYTHMIA 11/21/2008  . CONSTIPATION 11/21/2008  . IRRITABLE BOWEL SYNDROME 11/21/2008  . ABDOMINAL PAIN, GENERALIZED, CHRONIC 11/21/2008  . SEIZURES, HX OF 11/21/2008  . HYPOTHYROIDISM 10/23/2008  . HYPERLIPIDEMIA 10/23/2008  . ANXIETY 10/23/2008  . DEPRESSION 10/23/2008  . GERD 10/23/2008  . CELIAC SPRUE 10/23/2008  . [place] SYNDROME 10/23/2008  . OSTEOARTHRITIS 10/23/2008  . INSOMNIA 10/23/2008  . PERSONAL HISTORY OF MALIGNANT NEOPLASM OF BREAST 10/23/2008  . SKIN CANCER, HX OF 10/23/2008  . HEART MURMUR, HX OF 10/23/2008    Artist Pais, PTA 02/25/2021, 6:37 PM  [facility] [address]  [place], [place], [address] Phone: [phone]   Fax:  [phone]  Name: Vickie Roberts MRN: [id number] Date of Birth: [birth year]  PHYSICAL THERAPY DISCHARGE SUMMARY  Visits from Start of Care: 2  Current functional level related to goals / functional outcomes:   As of 03/02/2021, the patient's daughter, Adele Barthel, called  to cancel all of the patient's appointments stating she was not able to tolerate OP PT and was going to contact Dr. Berenice Primas to request [facility] PT.   Remaining deficits:   As above. Unable to formally assess status at discharge due to inability to return to PT.   Education / Equipment:   Initial HEP  Plan: Patient agrees to discharge.  Patient goals were not met. Patient is being discharged due to the patient's request.  ?????     Percival Spanish, PT, MPT 03/02/21, 11:27 AM  [facility] [address]  [place], [place], [address] Phone: [phone]   Fax:  [phone]

## 2021-03-02 ENCOUNTER — Ambulatory Visit: Payer: Medicare Other

## 2021-03-04 ENCOUNTER — Ambulatory Visit: Payer: Medicare Other | Admitting: Physical Therapy

## 2021-05-24 ENCOUNTER — Emergency Department (HOSPITAL_BASED_OUTPATIENT_CLINIC_OR_DEPARTMENT_OTHER): Payer: Medicare Other

## 2021-05-24 ENCOUNTER — Other Ambulatory Visit: Payer: Self-pay

## 2021-05-24 ENCOUNTER — Encounter (HOSPITAL_BASED_OUTPATIENT_CLINIC_OR_DEPARTMENT_OTHER): Payer: Self-pay

## 2021-05-24 ENCOUNTER — Emergency Department (HOSPITAL_BASED_OUTPATIENT_CLINIC_OR_DEPARTMENT_OTHER)
Admission: EM | Admit: 2021-05-24 | Discharge: 2021-05-25 | Disposition: A | Discharge status: Home / Self Care | Payer: Medicare Other | Attending: Emergency Medicine | Admitting: Emergency Medicine

## 2021-05-24 DIAGNOSIS — U071 COVID-19: Secondary | ICD-10-CM | POA: Insufficient documentation

## 2021-05-24 DIAGNOSIS — R509 Fever, unspecified: Secondary | ICD-10-CM | POA: Diagnosis present

## 2021-05-24 DIAGNOSIS — N189 Chronic kidney disease, unspecified: Secondary | ICD-10-CM | POA: Diagnosis not present

## 2021-05-24 DIAGNOSIS — E871 Hypo-osmolality and hyponatremia: Secondary | ICD-10-CM | POA: Diagnosis not present

## 2021-05-24 DIAGNOSIS — E876 Hypokalemia: Secondary | ICD-10-CM | POA: Diagnosis not present

## 2021-05-24 DIAGNOSIS — E039 Hypothyroidism, unspecified: Secondary | ICD-10-CM | POA: Insufficient documentation

## 2021-05-24 HISTORY — DX: Disorder of kidney and ureter, unspecified: N28.9

## 2021-05-24 LAB — CBC WITH DIFFERENTIAL/PLATELET
Abs Immature Granulocytes: 0.06 10*3/uL (ref 0.00–0.07)
Basophils Absolute: 0.1 10*3/uL (ref 0.0–0.1)
Basophils Relative: 1 %
Eosinophils Absolute: 0 10*3/uL (ref 0.0–0.5)
Eosinophils Relative: 1 %
HCT: 32.8 % — ABNORMAL LOW (ref 36.0–46.0)
Hemoglobin: 11.3 g/dL — ABNORMAL LOW (ref 12.0–15.0)
Immature Granulocytes: 1 %
Lymphocytes Relative: 10 %
Lymphs Abs: 0.6 10*3/uL — ABNORMAL LOW (ref 0.7–4.0)
MCH: 32.9 pg (ref 26.0–34.0)
MCHC: 34.5 g/dL (ref 30.0–36.0)
MCV: 95.6 fL (ref 80.0–100.0)
Monocytes Absolute: 0.7 10*3/uL (ref 0.1–1.0)
Monocytes Relative: 12 %
Neutro Abs: 4.7 10*3/uL (ref 1.7–7.7)
Neutrophils Relative %: 75 %
Platelets: 233 10*3/uL (ref 150–400)
RBC: 3.43 MIL/uL — ABNORMAL LOW (ref 3.87–5.11)
RDW: 16.1 % — ABNORMAL HIGH (ref 11.5–15.5)
WBC: 6.2 10*3/uL (ref 4.0–10.5)
nRBC: 0 % (ref 0.0–0.2)

## 2021-05-24 LAB — COMPREHENSIVE METABOLIC PANEL
ALT: 26 U/L (ref 0–44)
AST: 22 U/L (ref 15–41)
Albumin: 3.4 g/dL — ABNORMAL LOW (ref 3.5–5.0)
Alkaline Phosphatase: 53 U/L (ref 38–126)
Anion gap: 8 (ref 5–15)
BUN: 18 mg/dL (ref 8–23)
CO2: 26 mmol/L (ref 22–32)
Calcium: 8.6 mg/dL — ABNORMAL LOW (ref 8.9–10.3)
Chloride: 93 mmol/L — ABNORMAL LOW (ref 98–111)
Creatinine, Ser: 0.87 mg/dL (ref 0.44–1.00)
GFR, Estimated: >60 mL/min (ref >60)
Glucose, Bld: 108 mg/dL — ABNORMAL HIGH (ref 70–99)
Potassium: 3.3 mmol/L — ABNORMAL LOW (ref 3.5–5.1)
Sodium: 127 mmol/L — ABNORMAL LOW (ref 135–145)
Total Bilirubin: 1.1 mg/dL (ref 0.3–1.2)
Total Protein: 6.1 g/dL — ABNORMAL LOW (ref 6.5–8.1)

## 2021-05-24 LAB — LIPASE, BLOOD: Lipase: 20 U/L (ref 11–51)

## 2021-05-24 MED ORDER — SODIUM CHLORIDE 0.9 % IV BOLUS
1000.0000 mL | Freq: Once | INTRAVENOUS | Status: AC
  Administered 2021-05-24: 1000 mL via INTRAVENOUS

## 2021-05-24 MED ORDER — LACTATED RINGERS IV BOLUS
1000.0000 mL | Freq: Once | INTRAVENOUS | Status: AC
  Administered 2021-05-24: 1000 mL via INTRAVENOUS

## 2021-05-24 NOTE — ED Triage Notes (Signed)
Pt tested positive for COVID while at [facility] this AM. Pt was told by [facility] to come to ED if SPO2 dropped below 94%. Per daughter pt's SpO2 got to 92%. Pt's current symptoms are fatigue, GI upset, fever with TMax of 102.2, cough, generalized weakness. Pt's first symptoms started 3-4 days ago.

## 2021-05-25 DIAGNOSIS — U071 COVID-19: Secondary | ICD-10-CM | POA: Diagnosis not present

## 2021-05-25 LAB — URINALYSIS, ROUTINE W REFLEX MICROSCOPIC
Bilirubin Urine: NEGATIVE
Glucose, UA: NEGATIVE mg/dL
Hgb urine dipstick: NEGATIVE
Ketones, ur: NEGATIVE mg/dL
Nitrite: NEGATIVE
Protein, ur: NEGATIVE mg/dL
Specific Gravity, Urine: 1.01 (ref 1.005–1.030)
pH: 7 (ref 5.0–8.0)

## 2021-05-25 LAB — URINALYSIS, MICROSCOPIC (REFLEX)

## 2021-05-25 NOTE — ED Provider Notes (Signed)
[facility] EMERGENCY DEPARTMENT Provider Note   CSN: [id number] Arrival date & time: 05/24/21  2119     History Chief Complaint  Patient presents with   Covid Positive    Vickie Roberts is a 87 y.o. female.  87 year old female who started feeling ill it sounds like on Thursday.  She had a couple episodes of emesis and GI discomfort.  She developed an little bit of a worsening cough and just weakness and decreased appetite over the weekend but then yesterday she had a fever.  She was taken to urgent care this morning where they diagnosed with COVID and told her for sats Below 94% that she needed to come to the emergency room immediately.  Her daughter brought a pulse ox and her oxygen saturation got as low as 92 so she was concerned and brought her here.  She states that she actually seems to be doing better.  She states that she seems to be eating a little bit better her energy seems to be little bit better and she is having less weakness.  Patient has no complaints at this time and is not sure why she is here.       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   LEUKOCYTOPENIA UNSPECIFIED 01/02/2009   OTHER SYMPTOMS INVOLVING URINARY SYSTEM 12/29/2008   GLUCOSE INTOLERANCE 11/21/2008   MYOCARDIAL INFARCTION, HX OF 11/21/2008   CARDIAC ARRHYTHMIA 11/21/2008   CONSTIPATION 11/21/2008   IRRITABLE BOWEL SYNDROME 11/21/2008   ABDOMINAL PAIN, GENERALIZED, CHRONIC 11/21/2008   SEIZURES, HX OF 11/21/2008   HYPOTHYROIDISM 10/23/2008   HYPERLIPIDEMIA 10/23/2008   ANXIETY 10/23/2008   DEPRESSION 10/23/2008   GERD 10/23/2008   CELIAC SPRUE 10/23/2008   SJOGREN'S SYNDROME 10/23/2008    OSTEOARTHRITIS 10/23/2008   INSOMNIA 10/23/2008   PERSONAL HISTORY OF MALIGNANT NEOPLASM OF BREAST 10/23/2008   SKIN CANCER, HX OF 10/23/2008   HEART MURMUR, HX OF 10/23/2008    Past Surgical History:  Procedure Laterality Date   ABDOMINAL HYSTERECTOMY     BREAST SURGERY     CARPAL TUNNEL RELEASE     KNEE SURGERY       OB History   No obstetric history on file.     Family History  Problem Relation Age of Onset   Heart disease Mother    Breast cancer Mother    Heart disease Father     Social History   Tobacco Use   Smoking status: Never   Smokeless tobacco: Never  Substance Use Topics   Alcohol use: No    Alcohol/week: 0.0 standard drinks   Drug use: No    Home Medications Prior to Admission medications   Medication Sig Start Date End Date Taking? Authorizing Provider  acetaminophen (TYLENOL) 500 MG tablet Take by mouth.    [provider]  Biotin 1 MG CAPS Take by mouth.    [provider]  buPROPion (WELLBUTRIN SR) 150 MG 12 hr tablet Take 1 tablet (150 mg total) by mouth daily. 12/28/20   Cottle, Steva Ready., MD  cholecalciferol (VITAMIN D) 1000 UNITS tablet Take 1,000 Units by mouth daily.    [provider]  diphenoxylate-atropine (LOMOTIL) 2.5-0.025 MG tablet Take by mouth.    [provider]  donepezil (ARICEPT) 10 MG tablet Take 1 tablet (10 mg total) by mouth at bedtime. 01/21/21   Cottle, Steva Ready., MD  gabapentin (NEURONTIN) 300 MG capsule START TAKING 1 CAP 2 TIMES DAILY FOR 2 DAYS, THEN MAY INCREASE TO 3 TIMES DAILY Patient not taking: No sig reported 12/25/18   [provider]  L-Methylfolate 15 MG TABS Take 1 tablet by mouth every day 01/21/21   Cottle, Steva Ready., MD  L-Methylfolate-Algae (DEPLIN 7.5 PO) Take by mouth.    [provider]  leflunomide (ARAVA) 20 MG tablet  06/08/15   [provider]  mirtazapine (REMERON) 30 MG tablet Take 1 tablet (30 mg total) by mouth at bedtime. Patient  not taking: Reported on 02/23/2021 01/06/21   Lauraine Rinne., MD  omeprazole (PRILOSEC) 20 MG capsule Take by mouth.    [provider]  ondansetron (ZOFRAN) 4 MG tablet  06/21/19   [provider]  predniSONE (DELTASONE) 5 MG tablet Take 5 mg by mouth daily with breakfast.    [provider]    Allergies    Codeine, Tramadol, Erythromycin, Hydrocodone, Keflex [cephalexin], and Sulfonamide derivatives  Review of Systems   Review of Systems  All other systems reviewed and are negative.  Physical Exam Updated Vital Signs BP 140/86 (BP Location: Right Arm)   Pulse 81   Temp 98.3 F (36.8 C) (Oral)   Resp 18   Ht  (1.626 m)   Wt 56.7 kg   SpO2 94%   BMI 21.46 kg/m   Physical Exam Vitals and nursing note reviewed.  HENT:     Head: Normocephalic.     Nose: Nose normal. No congestion or rhinorrhea.     Mouth/Throat:     Mouth: Mucous membranes are moist.     Pharynx: Oropharynx is clear.  Eyes:     Pupils: Pupils are equal, round, and reactive to light.     Comments: sunken  Cardiovascular:     Rate and Rhythm: Normal rate.  Pulmonary:     Effort: Pulmonary effort is normal. No respiratory distress.     Breath sounds: No wheezing.  Abdominal:     General: There is no distension.     Tenderness: There is no abdominal tenderness.  Musculoskeletal:        General: No swelling or tenderness. Normal range of motion.  Skin:    General: Skin is warm and dry.     Comments: Skin tenting present  Neurological:     General: No focal deficit present.    ED Results / Procedures / Treatments   Labs (all labs ordered are listed, but only abnormal results are displayed) Labs Reviewed  CBC WITH DIFFERENTIAL/PLATELET - Abnormal; Notable for the following components:      Result Value   RBC 3.43 (*)    Hemoglobin 11.3 (*)    HCT 32.8 (*)    RDW 16.1 (*)    Lymphs Abs 0.6 (*)    All other components within normal limits  COMPREHENSIVE METABOLIC  PANEL - Abnormal; Notable for the following components:   Sodium 127 (*)    Potassium 3.3 (*)    Chloride 93 (*)    Glucose, Bld 108 (*)    Calcium 8.6 (*)    Total Protein 6.1 (*)    Albumin 3.4 (*)    All other components  within normal limits  URINALYSIS, ROUTINE W REFLEX MICROSCOPIC - Abnormal; Notable for the following components:   Leukocytes,Ua SMALL (*)    All other components within normal limits  URINALYSIS, MICROSCOPIC (REFLEX) - Abnormal; Notable for the following components:   Bacteria, UA RARE (*)    Non Squamous Epithelial PRESENT (*)    All other components within normal limits  URINE CULTURE  LIPASE, BLOOD    EKG EKG Interpretation  Date/Time:  Monday May 24 2021 22:07:48 EDT Ventricular Rate:  70 PR Interval:  205 QRS Duration: 101 QT Interval:  404 QTC Calculation: 436 R Axis:   -65 Text Interpretation: Sinus rhythm Probable left atrial enlargement Left anterior fascicular block Left ventricular hypertrophy Anterior infarct, old Confirmed by Marily Memos [id number]) on 05/24/2021 11:15:39 PM  Radiology DG Chest Portable 1 View  Result Date: 05/24/2021 CLINICAL DATA:  Shortness of breath and COVID-19 EXAM: PORTABLE CHEST 1 VIEW COMPARISON:  09/25/2020 FINDINGS: The heart size and mediastinal contours are within normal limits. Both lungs are clear. The visualized skeletal structures are unremarkable. IMPRESSION: No active disease. Electronically Signed   By: Deatra Robinson M.D.   On: 05/24/2021 22:41    Procedures Procedures   Medications Ordered in ED Medications  lactated ringers bolus 1,000 mL ( Intravenous Stopped 05/24/21 2324)  sodium chloride 0.9 % bolus 1,000 mL ( Intravenous Stopped 05/25/21 0109)    ED Course  I have reviewed the triage vital signs and the nursing notes.  Pertinent labs & imaging results that were available during my care of the patient were reviewed by me and considered in my medical decision making (see chart for details).     MDM Rules/Calculators/A&P                          Hyponatremia and hypokalemia likely related to decreased intake.  Prior to diss charge from here she had 1 L normal saline and 1 L of LR bolus.  She urinated 3 times which likely help with the dehydration.  Suspect that her sodium is improved somewhat with this but will also significantly improved (along with her hyorkalemia) as she resumes a normal diet.  Will supplement with Pedialyte until that time.  Oxygen saturation as low as 88 to 89% while sleeping but when awake and/or taking deep breaths she was normally in the 95-100 range.  Lungs are clear.  She is no respiratory distress.  Her x-ray looks fine.  I have a low suspicion for impending respiratory failure and x-ray think she is on her way to improvement.  Patient stable for discharge at this time.   Final Clinical Impression(s) / ED Diagnoses Final diagnoses:  Hypokalemia  Hyponatremia    Rx / DC Orders ED Discharge Orders     None        Keltin Baird, Barbara Cower, MD 05/25/21 [phone]

## 2021-05-26 LAB — URINE CULTURE: Culture: NO GROWTH

## 2021-06-14 ENCOUNTER — Telehealth: Payer: Self-pay | Admitting: Psychiatry

## 2021-06-14 NOTE — Telephone Encounter (Signed)
She can give up to a half of clonazepam 3 times a day if it helps the anxiety without worsening her cognitive problem.  If it worsens the cognitive problem then we will have to switch to something different.  Covid is well known to cause cognitive problems and in an elderly person the cognitive problems could be markedly worse.  Most people recover but not all people.  Her primary care doctor will no what kind of tests to do to make sure it is not caused by other things such as a UTI.

## 2021-06-14 NOTE — Telephone Encounter (Signed)
Vickie Roberts with concerns about her mom. She stated she had a 3 day hospital stay because of COVID. Since returning home she has a significant cognitive decline.Please advise. # [phone].

## 2021-06-14 NOTE — Telephone Encounter (Signed)
Reviewed

## 2021-06-14 NOTE — Telephone Encounter (Signed)
Vickie Roberts stated since returning from the hospital Vickie Roberts does not realize she is home.She keeps asking when she is going home because she thought she was still in the hospital.Then she thought she was at church camp.Her daughter tried telling her she was home but she insisted home is their old address that they moved from 10 years ago.She keeps asking her caregiver to take her there.She thinks she's in someone else's house.Vickie Roberts gives her 1/2 tablet of klonopin daily but wants to know if she can give her more daily for the agitation and anxiety she is having for not being "home".She is seeing her PCP tomorrow and wants to know if there is anything they should find out from them.

## 2021-06-14 NOTE — Telephone Encounter (Signed)
Alvino Chapel informed

## 2021-06-15 ENCOUNTER — Other Ambulatory Visit: Payer: Self-pay | Admitting: Psychiatry

## 2021-06-15 DIAGNOSIS — F5105 Insomnia due to other mental disorder: Secondary | ICD-10-CM

## 2021-06-15 DIAGNOSIS — F3342 Major depressive disorder, recurrent, in full remission: Secondary | ICD-10-CM

## 2021-06-15 DIAGNOSIS — F411 Generalized anxiety disorder: Secondary | ICD-10-CM

## 2021-06-15 MED ORDER — CLONAZEPAM 0.5 MG PO TABS: 0.2500 mg | ORAL_TABLET | Freq: Three times a day (TID) | ORAL | 0 refills | Status: DC | PRN

## 2021-06-15 NOTE — Telephone Encounter (Signed)
Daughter called and would like a refill of the clonazapam sent in for the new dose

## 2021-06-15 NOTE — Telephone Encounter (Signed)
Sent clonazepam prescription

## 2021-06-15 NOTE — Telephone Encounter (Signed)
Please review

## 2021-07-23 ENCOUNTER — Other Ambulatory Visit: Payer: Self-pay | Admitting: Psychiatry

## 2021-07-23 DIAGNOSIS — F3342 Major depressive disorder, recurrent, in full remission: Secondary | ICD-10-CM

## 2021-07-28 ENCOUNTER — Ambulatory Visit: Payer: Medicare Other | Admitting: Psychiatry

## 2021-08-04 ENCOUNTER — Telehealth: Payer: Self-pay | Admitting: Psychiatry

## 2021-08-04 NOTE — Telephone Encounter (Signed)
Daughter and asked for a refill of the klonopin. She also wants something to help with her depression. She said her mom is crying daily. She tried the remeron and that made her sick and throw up. The daughter said she would like something to boost the wellbutrin. Please call the daughter at [phone]. [facility] has an appt in november

## 2021-08-04 NOTE — Telephone Encounter (Signed)
Please review

## 2021-08-06 ENCOUNTER — Other Ambulatory Visit: Payer: Self-pay | Admitting: Psychiatry

## 2021-08-06 MED ORDER — CLONAZEPAM 0.5 MG PO TABS: 0.2500 mg | ORAL_TABLET | Freq: Three times a day (TID) | ORAL | 0 refills | Status: DC | PRN

## 2021-08-06 MED ORDER — PAROXETINE HCL 10 MG PO TABS: 5.0000 mg | ORAL_TABLET | Freq: Every day | ORAL | 0 refills | Status: DC

## 2021-08-06 NOTE — Telephone Encounter (Signed)
I sent a refill of clonazepam.  I also sent in a very low dose of an aunt to depressant paroxetine.  She is been very sensitive to side effects so I gave the lowest dose I could possibly prescribe.

## 2021-08-11 ENCOUNTER — Other Ambulatory Visit: Payer: Self-pay | Admitting: Psychiatry

## 2021-08-11 DIAGNOSIS — F3342 Major depressive disorder, recurrent, in full remission: Secondary | ICD-10-CM

## 2021-08-29 ENCOUNTER — Other Ambulatory Visit: Payer: Self-pay | Admitting: Psychiatry

## 2021-09-09 ENCOUNTER — Encounter: Payer: Self-pay | Admitting: Psychiatry

## 2021-09-09 ENCOUNTER — Telehealth: Payer: Self-pay | Admitting: Psychiatry

## 2021-09-09 NOTE — Telephone Encounter (Signed)
I will see how to do it some time.  But I took care of this 1.

## 2021-09-09 NOTE — Telephone Encounter (Signed)
Please review ,I am not sure how to update the record is this something you have to do?

## 2021-09-09 NOTE — Telephone Encounter (Signed)
Pt's daughter LVM.  She says she is her [employer] POA.  She called to advise that Dr. Jennelle Human had prescribed Paroxetine for her mom to enhance the Wellbutrin that she is taking.  However, she only took 1 pill and had nausea, so they are just going to stay with using only the Buproprion.  She wants her mom's record updated to reflect that she can not take the Paroxetine.  Next appt 11/28

## 2021-10-11 ENCOUNTER — Other Ambulatory Visit: Payer: Self-pay

## 2021-10-11 ENCOUNTER — Ambulatory Visit (INDEPENDENT_AMBULATORY_CARE_PROVIDER_SITE_OTHER): Payer: Medicare Other | Admitting: Psychiatry

## 2021-10-11 ENCOUNTER — Encounter: Payer: Self-pay | Admitting: Psychiatry

## 2021-10-11 DIAGNOSIS — F5105 Insomnia due to other mental disorder: Secondary | ICD-10-CM

## 2021-10-11 DIAGNOSIS — F411 Generalized anxiety disorder: Secondary | ICD-10-CM | POA: Diagnosis not present

## 2021-10-11 DIAGNOSIS — F331 Major depressive disorder, recurrent, moderate: Secondary | ICD-10-CM | POA: Diagnosis not present

## 2021-10-11 MED ORDER — CLONAZEPAM 0.5 MG PO TABS: 0.2500 mg | ORAL_TABLET | Freq: Three times a day (TID) | ORAL | 2 refills | Status: DC | PRN

## 2021-10-11 NOTE — Progress Notes (Signed)
Vickie Roberts [id number] 1932-09-08 87 y.o.  Subjective:   Patient ID:  Vickie Roberts is a 87 y.o. (DOB 30-Apr-1932) female.  Chief Complaint:  Chief Complaint  Patient presents with   Follow-up   Depression   Anxiety   Memory Loss   Depression        Associated symptoms include no decreased concentration, no fatigue and no suicidal ideas.  Past medical history includes anxiety.   Anxiety Patient reports no confusion, decreased concentration, dizziness, nervous/anxious behavior or suicidal ideas.    Vickie Roberts presents to the office today for follow-up of depression and anxiety  seen August 2020 and added mirtazapine 30 to help depression, anxiety and sleep.  "It's a miracle"  Bc she can sleep.  Never was a good sleeper.  Disc SE in detail.  Initially sleepy in the morning but better now.  At least 6-7 hours of sleep.  Usually will lie down daytime.  Less anxious and depressed also and pleased.  Stomach pain is better.  Has to make herself eat.  Regained 1 #.  Not losing.  Energy is better.  seen September 2020.  Her depression had resolved with the increase and addition of mirtazapine 30 mg daily.  No other med changes occurred. Continue mirtazapine 30 mg nightly. Should help appetite, depression and anxiety. Also continue Wellbutrin SR 150 mg every morning Clonazepam 0.5 mg tablet 1/4 tablet in the morning and 1/2 tablet nightly Gabapentin 300 mg 3 times daily Deplin 7.5 mg daily  11/2019 appt with the following noted: Alright but very sleepy today.  Question about mirtazapine reducing to reduce sleepiness. Sleep 8 hours. Rare actual nap in daytime.   Isolation is hard.  She has stopped gabapentin without a problem. Patient reports stable mood and denies depressed or irritable moods.  Patient denies any recent difficulty with anxiety.  Patient denies difficulty with sleep initiation or maintenance. Denies appetite disturbance.  Patient reports that energy and motivation have been  good.  Patient denies any difficulty with concentration but more trouble with memory.  Patient denies any suicidal ideation. No further near syncope episode.     Has noticed some memory problems.  Forgot how to do some things temporarily.  FHx dementia in mother and sister.  Plan: Relapse of depression and anxiety with uncertain trigger but resolved with mirtazapine 30 mg. Continue mirtazapine 30 mg nightly. Should help appetite, depression and anxiety. No med changes. Also continue Wellbutrin SR 150 mg every morning Clonazepam 0.5 mg tablet 1/4 tablet in the morning and 1/2 tablet nightly Deplin 7.5 mg daily  06/09/20 appt with the following noted: Vaccinated. Not very well.  H died unexpectedly 2020-01-01 massive stroke and still surprised bc he was health.  Grieving. Knee problems.  She guesses it's normal grief.  Lives alone but Vickie Roberts lives a mile away and checks on her all the time.  She's handling affairs. Pt had unhappy childhood but good marriage. Married 65 years. Memory is worse Usually sleep pretty well.   Plan: No med changes  12/28/2020 appointment with the following noted:  Seen with D 1 year anniversary of H's death.  Was married 66 years. Weepy and still grieving in the morning worse. Has been off mirtazapine for months apparently; at least for 2 mos. Appetite fine. And weight stable.  Weight gain a little. Usually sleep is OK but occ not. One of the kids spends night with her. She thinks she's been depressed some the last few months.  Not nervous and D agrees. Not taking clonazepam lately. Plan: Add donepezil for memory Continue mirtazapine 30 mg nightly Continue bupropion SR 150 mg every morning Continue clonazepam 0.5 mg tablet 3 times daily as needed anxiety  06/14/2021 phone call from daughter that after hospitalization she came home she was confused and anxious.  08/04/2021 phone call from daughter stating patient was more depressed.  Because she also have his  anxiety paroxetine 10 mg was added.  She was encouraged to get an appointment as soon as possible  09/09/2021 phone call from daughter stating that the patient took 1 paroxetine and had nausea and did not take it anymore  10/11/2021 appointment with the following noted:seen with daughter, Vickie Roberts has called several times with questions about her medications since her last appointment. Very tired.  Couple of rest times daily.  Sleep is OK at night.   Caregivers 24/7 and D checks on her daily. D notes intermittent anxiety and it seems a bit worse.  D gone for 2 weeks and it seemed to trigger more anxiety and has forgotten that her H died.   Rarely taking clonazepam except 0.25 mg HS. SP hospitalized with Covid in summer and was confused for 3 weeks thereafter. Then became oriented to home again. Pt unsure about depression but D thinks it's not debilitating.   Not able to walk as much as usual.    D less available bc was helping her a lot and no longer can do it bc Vickie Roberts caring for her grand baby and patient upset over this and "miss how it was".  Has not driven for months and afraid to do so.  Past Psychiatric Medication Trials: Citalopram, paroxetine nausea, Lexapro ID and insomnia, mirtazapine 7.5 sedation,  Pristiq nausea, Wellbutrin 300, imipramine side effects Mirtazapine 30 benefit. buspirone dizzy, ,  clonazepam, lorazepam  Gabapentin 300 TID   Review of Systems:  Review of Systems  Constitutional:  Negative for fatigue and unexpected weight change.       Lost 15#/8 weeks unintentionally bc difficulty eating  Musculoskeletal:  Positive for arthralgias, back pain and gait problem.  Neurological:  Positive for weakness. Negative for dizziness and tremors.  Psychiatric/Behavioral:  Positive for dysphoric mood. Negative for agitation, behavioral problems, confusion, decreased concentration, hallucinations, self-injury, sleep disturbance and suicidal ideas. The patient is not  nervous/anxious and is not hyperactive.    Medications: I have reviewed the patient's current medications.  Current Outpatient Medications  Medication Sig Dispense Refill   acetaminophen (TYLENOL) 500 MG tablet Take by mouth.     Biotin 1 MG CAPS Take by mouth.     buPROPion (WELLBUTRIN SR) 150 MG 12 hr tablet TAKE 1 TABLET BY MOUTH EVERY DAY 90 tablet 0   cholecalciferol (VITAMIN D) 1000 UNITS tablet Take 1,000 Units by mouth daily.     diphenoxylate-atropine (LOMOTIL) 2.5-0.025 MG tablet Take by mouth.     donepezil (ARICEPT) 10 MG tablet Take 1 tablet (10 mg total) by mouth at bedtime. 90 tablet 3   gabapentin (NEURONTIN) 100 MG capsule Take 100 mg by mouth 2 (two) times daily.     L-Methylfolate 15 MG TABS Take 1 tablet by mouth every day 90 tablet 3   L-Methylfolate-Algae (DEPLIN 7.5 PO) Take by mouth.     leflunomide (ARAVA) 20 MG tablet      mirtazapine (REMERON) 30 MG tablet Take 1 tablet (30 mg total) by mouth at bedtime. 90 tablet 0   omeprazole (PRILOSEC) 20 MG capsule Take  by mouth.     ondansetron (ZOFRAN) 4 MG tablet      predniSONE (DELTASONE) 5 MG tablet Take 5 mg by mouth daily with breakfast.     clonazePAM (KLONOPIN) 0.5 MG tablet Take 0.5 tablets (0.25 mg total) by mouth 3 (three) times daily as needed for anxiety. 45 tablet 2   No current facility-administered medications for this visit.    Medication Side Effects: None  Allergies:  Allergies  Allergen Reactions   Codeine Other (See Comments)   Paroxetine Nausea Only   Tramadol Other (See Comments)   Erythromycin    Hydrocodone    Keflex [Cephalexin]    Sulfonamide Derivatives     Past Medical History:  Diagnosis Date   Abnormal finding of blood chemistry    Depression    Osteoarthritis    RA (rheumatoid arthritis) (HCC)    Renal disorder    CKD    Family History  Problem Relation Age of Onset   Heart disease Mother    Breast cancer Mother    Heart disease Father     Social History    Socioeconomic History   Marital status: Married    Spouse name: Not on file   Number of children: 4   Years of education: HS   Highest education level: Not on file  Occupational History   Occupation: Retired  Tobacco Use   Smoking status: Never   Smokeless tobacco: Never  Substance and Sexual Activity   Alcohol use: No    Alcohol/week: 0.0 standard drinks   Drug use: No   Sexual activity: Yes    Birth control/protection: Post-menopausal  Other Topics Concern   Not on file  Social History Narrative   Occasional caffeine use    Social Determinants of Corporate investment banker Strain: Not on file  Food Insecurity: Not on file  Transportation Needs: Not on file  Physical Activity: Not on file  Stress: Not on file  Social Connections: Not on file  Intimate Partner Violence: Not on file    Past Medical History, Surgical history, Social history, and Family history were reviewed and updated as appropriate.   Lives with Vickie Roberts who's in good health.  His mother lived to 66 yo.  In [facility].  Please see review of systems for further details on the patient's review from today.   Objective:   Physical Exam:  There were no vitals taken for this visit.  Physical Exam Constitutional:      General: She is not in acute distress.    Appearance: She is well-developed.  Musculoskeletal:        General: No deformity.  Neurological:     Mental Status: She is alert and oriented to person, place, and time.     Motor: Weakness present. No tremor.     Coordination: Coordination normal.     Gait: Gait abnormal.  Psychiatric:        Attention and Perception: Attention normal. She is attentive.        Mood and Affect: Mood is anxious. Mood is not depressed. Affect is tearful. Affect is not labile, blunt, angry or inappropriate.        Speech: Speech normal.        Behavior: Behavior normal.        Thought Content: Thought content normal. Thought content does not include  homicidal or suicidal ideation. Thought content does not include homicidal or suicidal plan.        Cognition  and Memory: Cognition is impaired. She exhibits impaired recent memory and impaired remote memory.        Judgment: Judgment normal.     Comments: Insight is good. Grief.    Lab Review:     Component Value Date/Time   NA 127 (L) 05/24/2021 2226   K 3.3 (L) 05/24/2021 2226   CL 93 (L) 05/24/2021 2226   CO2 26 05/24/2021 2226   GLUCOSE 108 (H) 05/24/2021 2226   BUN 18 05/24/2021 2226   CREATININE 0.87 05/24/2021 2226   CALCIUM 8.6 (L) 05/24/2021 2226   PROT 6.1 (L) 05/24/2021 2226   PROT 6.3 06/15/2015 1409   ALBUMIN 3.4 (L) 05/24/2021 2226   AST 22 05/24/2021 2226   ALT 26 05/24/2021 2226   ALKPHOS 53 05/24/2021 2226   BILITOT 1.1 05/24/2021 2226   GFRNONAA >60 05/24/2021 2226   GFRAA 105 01/21/2009 0954       Component Value Date/Time   WBC 6.2 05/24/2021 2226   RBC 3.43 (L) 05/24/2021 2226   HGB 11.3 (L) 05/24/2021 2226   HCT 32.8 (L) 05/24/2021 2226   PLT 233 05/24/2021 2226   MCV 95.6 05/24/2021 2226   MCH 32.9 05/24/2021 2226   MCHC 34.5 05/24/2021 2226   RDW 16.1 (H) 05/24/2021 2226   LYMPHSABS 0.6 (L) 05/24/2021 2226   MONOABS 0.7 05/24/2021 2226   EOSABS 0.0 05/24/2021 2226   BASOSABS 0.1 05/24/2021 2226    No results found for: POCLITH, LITHIUM   No results found for: PHENYTOIN, PHENOBARB, VALPROATE, CBMZ   .res Assessment: Plan:    Jakalyn was seen today for follow-up, depression, anxiety and memory loss.  Diagnoses and all orders for this visit:  Major depressive disorder, recurrent episode, moderate (HCC)  Generalized anxiety disorder -     clonazePAM (KLONOPIN) 0.5 MG tablet; Take 0.5 tablets (0.25 mg total) by mouth 3 (three) times daily as needed for anxiety.  Insomnia due to mental condition    Greater than 50% of 30-minute face to face time with patient was spent on counseling and coordination of care. We discussed Consider light  box.  She has had no problem with seasonal depression so far this year, though this is been a problem in the past.  Still grief issues    Patient has a long history of chronic anxiety and depression but has done well overall on the current meds.  Originally she was on higher much higher dose benzodiazepine and she has been able to reduce the dose and maintain on this lower dose but not able to completely discontinue  Disc ways to manage her forgetfulness. Memory has declined since here.  Relapse of depression and anxiety with uncertain trigger but improved with mirtazapine 30 mg. Consider Viibryd if needed.  Poor tolerance of SSRIs and easily upset stomach.  Also continue Wellbutrin SR 150 mg every morning Successful stopping clonazepam except HS and rarely otherwise. Deplin 7.5 mg daily Disc dosing of Deplin.  They may try splitting the dose to increase the Deplin 15 mg tablet, 1/2 twice daily for mood   We discussed the short-term risks associated with benzodiazepines including sedation and increased fall risk among others.  Discussed long-term side effect risk including dependence, potential withdrawal symptoms, and the potential eventual dose-related risk of dementia.  This relationship has been called into question by to recent studies and this was discussed.  She does not feel like she can go any lower and the clonazepam we have tried this in the  past with failure.  She is aware of causing potentially memory problems  Overall good response to medications  Follow-up 6 mos  Meredith Staggers, MD, DFAPA   No future appointments.   No orders of the defined types were placed in this encounter.     -------------------------------

## 2021-10-28 ENCOUNTER — Other Ambulatory Visit: Payer: Self-pay | Admitting: Psychiatry

## 2021-10-28 DIAGNOSIS — F3342 Major depressive disorder, recurrent, in full remission: Secondary | ICD-10-CM

## 2022-01-12 ENCOUNTER — Telehealth: Payer: Self-pay | Admitting: Psychiatry

## 2022-01-12 NOTE — Telephone Encounter (Signed)
Rtc to Vickie Roberts, pt took 2 Bupropion SR 150 mg this morning and Deplin. Pt has has no symptoms. Informed daughter that she shouldn't have any issues. Informed her the Bupropion can increase the seizure threshold but Deplin she is just fine. Recommend she monitor symptoms and if anything comes up to call back.  ?

## 2022-01-12 NOTE — Telephone Encounter (Signed)
Daughter, Alvino Chapel, called because apparently Vickie Roberts took 2 buspar and 1 full deplin this morning when she took her morning medication.  She is wanting a call back to know signs/symptoms she should look for because too much medication was taken.  Please call [phone] ?

## 2022-01-12 NOTE — Telephone Encounter (Signed)
Please review

## 2022-02-10 ENCOUNTER — Other Ambulatory Visit: Payer: Self-pay | Admitting: Psychiatry

## 2022-03-03 ENCOUNTER — Telehealth: Payer: Self-pay

## 2022-03-03 NOTE — Telephone Encounter (Signed)
Daughter said patient is having difficulty sleeping. She is having increasing memory problems and is agitated at night. She is taking methocarbamol 500 mg 1/2 tab, tizanidine 2 mg 0.5-1, and gabapentin 100 mg at night in addition to her the medications prescribed by CR. She was asking about possibly adding Benadryl, but after talking with her she decided to try 1 whole tizanidine first and see how that works. She will call back and let us know.  ?

## 2022-04-13 ENCOUNTER — Encounter: Payer: Self-pay | Admitting: Psychiatry

## 2022-04-13 ENCOUNTER — Telehealth (INDEPENDENT_AMBULATORY_CARE_PROVIDER_SITE_OTHER): Payer: Medicare Other | Admitting: Psychiatry

## 2022-04-13 DIAGNOSIS — F411 Generalized anxiety disorder: Secondary | ICD-10-CM | POA: Diagnosis not present

## 2022-04-13 DIAGNOSIS — F5105 Insomnia due to other mental disorder: Secondary | ICD-10-CM

## 2022-04-13 DIAGNOSIS — F3342 Major depressive disorder, recurrent, in full remission: Secondary | ICD-10-CM | POA: Diagnosis not present

## 2022-04-13 MED ORDER — CLONAZEPAM 0.5 MG PO TABS: 0.2500 mg | ORAL_TABLET | Freq: Two times a day (BID) | ORAL | 2 refills | Status: AC | PRN

## 2022-04-13 MED ORDER — BUPROPION HCL ER (SR) 150 MG PO TB12: 150.0000 mg | ORAL_TABLET | Freq: Every day | ORAL | 3 refills | Status: AC

## 2022-04-13 NOTE — Progress Notes (Signed)
NYRIAH COOTE [id number] 31-Jul-1932 88 y.o.  Video Visit via My Chart  I connected with pt by video using My Chart and verified that I am speaking with the correct person using two identifiers.   I discussed the limitations, risks, security and privacy concerns of performing an evaluation and management service by My Chart  and the availability of in person appointments. I also discussed with the patient that there may be a patient responsible charge related to this service. The patient expressed understanding and agreed to proceed.  I discussed the assessment and treatment plan with the patient. The patient was provided an opportunity to ask questions and all were answered. The patient agreed with the plan and demonstrated an understanding of the instructions.   The patient was advised to call back or seek an in-person evaluation if the symptoms worsen or if the condition fails to improve as anticipated.  I provided 30 minutes of video time during this encounter.  The patient was located at home and the provider was located office. Session from 445 until 515 pm.  Subjective:   Patient ID:  MEHER KUCINSKI is a 88 y.o. (DOB 1932/03/05) female.  Chief Complaint:  Chief Complaint  Patient presents with   Follow-up   Depression   Anxiety   Depression        Associated symptoms include no decreased concentration, no fatigue and no suicidal ideas.  Past medical history includes anxiety.   Anxiety Patient reports no confusion, decreased concentration, dizziness, nervous/anxious behavior or suicidal ideas.    Rilda Bulls Korol presents to the office today for follow-up of depression and anxiety  seen August 2020 and added mirtazapine 30 to help depression, anxiety and sleep.  "It's a miracle"  Bc she can sleep.  Never was a good sleeper.  Disc SE in detail.  Initially sleepy in the morning but better now.  At least 6-7 hours of sleep.  Usually will lie down daytime.  Less anxious and depressed  also and pleased.  Stomach pain is better.  Has to make herself eat.  Regained 1 #.  Not losing.  Energy is better.  seen September 2020.  Her depression had resolved with the increase and addition of mirtazapine 30 mg daily.  No other med changes occurred. Continue mirtazapine 30 mg nightly. Should help appetite, depression and anxiety. Also continue Wellbutrin SR 150 mg every morning Clonazepam 0.5 mg tablet 1/4 tablet in the morning and 1/2 tablet nightly Gabapentin 300 mg 3 times daily Deplin 7.5 mg daily  11/2019 appt with the following noted: Alright but very sleepy today.  Question about mirtazapine reducing to reduce sleepiness. Sleep 8 hours. Rare actual nap in daytime.   Isolation is hard.  She has stopped gabapentin without a problem. Patient reports stable mood and denies depressed or irritable moods.  Patient denies any recent difficulty with anxiety.  Patient denies difficulty with sleep initiation or maintenance. Denies appetite disturbance.  Patient reports that energy and motivation have been good.  Patient denies any difficulty with concentration but more trouble with memory.  Patient denies any suicidal ideation. No further near syncope episode.     Has noticed some memory problems.  Forgot how to do some things temporarily.  FHx dementia in mother and sister.  Plan: Relapse of depression and anxiety with uncertain trigger but resolved with mirtazapine 30 mg. Continue mirtazapine 30 mg nightly. Should help appetite, depression and anxiety. No med changes. Also continue Wellbutrin SR 150 mg every  morning Clonazepam 0.5 mg tablet 1/4 tablet in the morning and 1/2 tablet nightly Deplin 7.5 mg daily  06/09/20 appt with the following noted: Vaccinated. Not very well.  H died unexpectedly 01-01-20 massive stroke and still surprised bc he was health.  Grieving. Knee problems.  She guesses it's normal grief.  Lives alone but Alvino Chapel lives a mile away and checks on her all the time.   She's handling affairs. Pt had unhappy childhood but good marriage. Married 65 years. Memory is worse Usually sleep pretty well.   Plan: No med changes  12/28/2020 appointment with the following noted:  Seen with D 1 year anniversary of H's death.  Was married 66 years. Weepy and still grieving in the morning worse. Has been off mirtazapine for months apparently; at least for 2 mos. Appetite fine. And weight stable.  Weight gain a little. Usually sleep is OK but occ not. One of the kids spends night with her. She thinks she's been depressed some the last few months.  Not nervous and D agrees. Not taking clonazepam lately. Plan: Add donepezil for memory Continue mirtazapine 30 mg nightly Continue bupropion SR 150 mg every morning Continue clonazepam 0.5 mg tablet 3 times daily as needed anxiety  06/14/2021 phone call from daughter that after hospitalization she came home she was confused and anxious.  08/04/2021 phone call from daughter stating patient was more depressed.  Because she also have his anxiety paroxetine 10 mg was added.  She was encouraged to get an appointment as soon as possible  09/09/2021 phone call from daughter stating that the patient took 1 paroxetine and had nausea and did not take it anymore  10/11/2021 appointment with the following noted:seen with daughter, Ronne Binning has called several times with questions about her medications since her last appointment. Very tired.  Couple of rest times daily.  Sleep is OK at night.   Caregivers 24/7 and D checks on her daily. D notes intermittent anxiety and it seems a bit worse.  D gone for 2 weeks and it seemed to trigger more anxiety and has forgotten that her H died.   Rarely taking clonazepam except 0.25 mg HS. SP hospitalized with Covid in summer and was confused for 3 weeks thereafter. Then became oriented to home again. Pt unsure about depression but D thinks it's not debilitating.   Not able to walk as much as  usual.   Plan: Also continue Wellbutrin SR 150 mg every morning Successful stopping clonazepam except HS and rarely otherwise. Deplin 7.5 mg daily Disc dosing of Deplin.  They may try splitting the dose to increase the Deplin 15 mg tablet, 1/2 twice daily for mood   04/13/2022 appointment with the following noted: D Alvino Chapel also on the call Rarely taking clonazepam except 0.25 mg HS. D reports she needs it sometimes. It helps.   Occ anxiety. Not really depressed.  It's a wonder I wouldn't be depressed with family stress. Usually sleeps good per pt and D with occ trouble going to sleep. 1 fall since here in bathroom in the night.   Appetite is good and weight stable.  But D reports she had lost some weight and then regained it. No SE Still on Wellbutrin SR 150 AM and Deplin. Does not want to try anything else for memory  D less available bc was helping her a lot and no longer can do it bc Alvino Chapel caring for her grand baby and patient upset over this and "miss how it  was".  Has not driven for months and afraid to do so.  Past Psychiatric Medication Trials: Citalopram, paroxetine nausea, Lexapro ID and insomnia, mirtazapine 7.5 sedation,  Pristiq nausea, Wellbutrin 300, imipramine side effects Mirtazapine 30 benefit. buspirone dizzy, ,  clonazepam, lorazepam  Gabapentin 300 TID Donepezil SE   Review of Systems:  Review of Systems  Constitutional:  Negative for fatigue and unexpected weight change.       Lost 15#/8 weeks unintentionally bc difficulty eating  Musculoskeletal:  Positive for arthralgias, back pain and gait problem.  Neurological:  Positive for weakness. Negative for dizziness and tremors.  Psychiatric/Behavioral:  Positive for dysphoric mood. Negative for agitation, behavioral problems, confusion, decreased concentration, hallucinations, self-injury, sleep disturbance and suicidal ideas. The patient is not nervous/anxious and is not hyperactive.    Medications: I have  reviewed the patient's current medications.  Current Outpatient Medications  Medication Sig Dispense Refill   acetaminophen (TYLENOL) 500 MG tablet Take by mouth.     Biotin 1 MG CAPS Take by mouth.     cholecalciferol (VITAMIN D) 1000 UNITS tablet Take 1,000 Units by mouth daily.     diphenoxylate-atropine (LOMOTIL) 2.5-0.025 MG tablet Take by mouth.     gabapentin (NEURONTIN) 100 MG capsule Take 100 mg by mouth 2 (two) times daily.     L-Methylfolate-Algae (DEPLIN 7.5 PO) Take by mouth.     leflunomide (ARAVA) 20 MG tablet      omeprazole (PRILOSEC) 20 MG capsule Take by mouth.     ondansetron (ZOFRAN) 4 MG tablet      predniSONE (DELTASONE) 5 MG tablet Take 5 mg by mouth daily with breakfast.     tizanidine (ZANAFLEX) 2 MG capsule Take 1 mg by mouth daily.     buPROPion (WELLBUTRIN SR) 150 MG 12 hr tablet Take 1 tablet (150 mg total) by mouth daily. 90 tablet 3   clonazePAM (KLONOPIN) 0.5 MG tablet Take 0.5 tablets (0.25 mg total) by mouth 2 (two) times daily as needed for anxiety. 45 tablet 2   No current facility-administered medications for this visit.    Medication Side Effects: None  Allergies:  Allergies  Allergen Reactions   Codeine Other (See Comments)   Paroxetine Nausea Only   Tramadol Other (See Comments)   Donepezil    Erythromycin    Hydrocodone    Keflex [Cephalexin]    Sulfonamide Derivatives     Past Medical History:  Diagnosis Date   Abnormal finding of blood chemistry    Depression    Osteoarthritis    RA (rheumatoid arthritis) (HCC)    Renal disorder    CKD    Family History  Problem Relation Age of Onset   Heart disease Mother    Breast cancer Mother    Heart disease Father     Social History   Socioeconomic History   Marital status: Married    Spouse name: Not on file   Number of children: 4   Years of education: HS   Highest education level: Not on file  Occupational History   Occupation: Retired  Tobacco Use   Smoking status:  Never   Smokeless tobacco: Never  Substance and Sexual Activity   Alcohol use: No    Alcohol/week: 0.0 standard drinks   Drug use: No   Sexual activity: Yes    Birth control/protection: Post-menopausal  Other Topics Concern   Not on file  Social History Narrative   Occasional caffeine use    Social Determinants of Health  Financial Resource Strain: Not on file  Food Insecurity: Not on file  Transportation Needs: Not on file  Physical Activity: Not on file  Stress: Not on file  Social Connections: Not on file  Intimate Partner Violence: Not on file    Past Medical History, Surgical history, Social history, and Family history were reviewed and updated as appropriate.   Lives with Shirlyn Goltz who's in good health.  His mother lived to 65 yo.  In [facility].  Please see review of systems for further details on the patient's review from today.   Objective:   Physical Exam:  There were no vitals taken for this visit.  Physical Exam Constitutional:      General: She is not in acute distress.    Appearance: She is well-developed.  Musculoskeletal:        General: No deformity.  Neurological:     Mental Status: She is alert and oriented to person, place, and time.     Motor: Weakness present. No tremor.     Coordination: Coordination normal.     Gait: Gait abnormal.  Psychiatric:        Attention and Perception: Attention normal. She is attentive.        Mood and Affect: Mood is anxious. Mood is not depressed. Affect is tearful. Affect is not labile, blunt, angry or inappropriate.        Speech: Speech normal.        Behavior: Behavior normal.        Thought Content: Thought content normal. Thought content does not include homicidal or suicidal ideation. Thought content does not include homicidal or suicidal plan.        Cognition and Memory: Cognition is impaired. She exhibits impaired recent memory and impaired remote memory.        Judgment: Judgment normal.      Comments: Insight is good. Grief.    Lab Review:     Component Value Date/Time   NA 127 (L) 05/24/2021 2226   K 3.3 (L) 05/24/2021 2226   CL 93 (L) 05/24/2021 2226   CO2 26 05/24/2021 2226   GLUCOSE 108 (H) 05/24/2021 2226   BUN 18 05/24/2021 2226   CREATININE 0.87 05/24/2021 2226   CALCIUM 8.6 (L) 05/24/2021 2226   PROT 6.1 (L) 05/24/2021 2226   PROT 6.3 06/15/2015 1409   ALBUMIN 3.4 (L) 05/24/2021 2226   AST 22 05/24/2021 2226   ALT 26 05/24/2021 2226   ALKPHOS 53 05/24/2021 2226   BILITOT 1.1 05/24/2021 2226   GFRNONAA >60 05/24/2021 2226   GFRAA 105 01/21/2009 0954       Component Value Date/Time   WBC 6.2 05/24/2021 2226   RBC 3.43 (L) 05/24/2021 2226   HGB 11.3 (L) 05/24/2021 2226   HCT 32.8 (L) 05/24/2021 2226   PLT 233 05/24/2021 2226   MCV 95.6 05/24/2021 2226   MCH 32.9 05/24/2021 2226   MCHC 34.5 05/24/2021 2226   RDW 16.1 (H) 05/24/2021 2226   LYMPHSABS 0.6 (L) 05/24/2021 2226   MONOABS 0.7 05/24/2021 2226   EOSABS 0.0 05/24/2021 2226   BASOSABS 0.1 05/24/2021 2226    No results found for: POCLITH, LITHIUM   No results found for: PHENYTOIN, PHENOBARB, VALPROATE, CBMZ   .res Assessment: Plan:    Marcos was seen today for follow-up, depression and anxiety.  Diagnoses and all orders for this visit:  Recurrent major depression in full remission (HCC) -     buPROPion (WELLBUTRIN SR) 150  MG 12 hr tablet; Take 1 tablet (150 mg total) by mouth daily.  Generalized anxiety disorder -     clonazePAM (KLONOPIN) 0.5 MG tablet; Take 0.5 tablets (0.25 mg total) by mouth 2 (two) times daily as needed for anxiety.  Insomnia due to mental condition     Greater than 50% of 30-minute face to face time with patient was spent on counseling and coordination of care. Patient has a long history of chronic anxiety and depression but has done well overall on the current meds.  Originally she was on higher much higher dose benzodiazepine and she has been able to reduce  the dose and maintain on this lower dose but not able to completely discontinue  Overall depression has been managed for the last 6 months well with just Wellbutrin SR 150 mg every morning.  She also takes L-methylfolate 7.5 mg daily which may be helping as well.  Disc ways to manage her forgetfulness. Memory has declined since here.   Poor tolerance of SSRIs and easily upset stomach.  Also continue Wellbutrin SR 150 mg every morning Successful stopping clonazepam except occasionally at night and 0.25 mg nightly. Deplin 7.5 mg daily Disc dosing of Deplin.  They may try splitting the dose to increase the Deplin 15 mg tablet, 1/2 twice daily for mood   We discussed the short-term risks associated with benzodiazepines including sedation and increased fall risk among others, especially given her age. Extensive discussion about fall risk.   Discussed long-term side effect risk including dependence, potential withdrawal symptoms, and the potential eventual dose-related risk of dementia.  But recent studies from 2020 dispute this association between benzodiazepines and dementia risk. Newer studies in 2020 do not support an association with dementia. She is aware of causing potentially memory problems  Overall good response to medications  Follow-up 6-9 mos  Meredith Staggersarey Cottle, MD, DFAPA   No future appointments.   No orders of the defined types were placed in this encounter.     -------------------------------

## 2022-05-13 ENCOUNTER — Emergency Department (HOSPITAL_BASED_OUTPATIENT_CLINIC_OR_DEPARTMENT_OTHER): Payer: Medicare Other

## 2022-05-13 ENCOUNTER — Other Ambulatory Visit: Payer: Self-pay

## 2022-05-13 ENCOUNTER — Emergency Department (HOSPITAL_BASED_OUTPATIENT_CLINIC_OR_DEPARTMENT_OTHER)
Admission: EM | Admit: 2022-05-13 | Discharge: 2022-05-13 | Disposition: A | Discharge status: Home / Self Care | Payer: Medicare Other | Attending: Emergency Medicine | Admitting: Emergency Medicine

## 2022-05-13 ENCOUNTER — Encounter (HOSPITAL_BASED_OUTPATIENT_CLINIC_OR_DEPARTMENT_OTHER): Payer: Self-pay | Admitting: Emergency Medicine

## 2022-05-13 DIAGNOSIS — R531 Weakness: Secondary | ICD-10-CM | POA: Diagnosis not present

## 2022-05-13 DIAGNOSIS — F0390 Unspecified dementia without behavioral disturbance: Secondary | ICD-10-CM | POA: Insufficient documentation

## 2022-05-13 DIAGNOSIS — S40812A Abrasion of left upper arm, initial encounter: Secondary | ICD-10-CM | POA: Insufficient documentation

## 2022-05-13 DIAGNOSIS — W5503XA Scratched by cat, initial encounter: Secondary | ICD-10-CM | POA: Diagnosis not present

## 2022-05-13 DIAGNOSIS — S4992XA Unspecified injury of left shoulder and upper arm, initial encounter: Secondary | ICD-10-CM | POA: Diagnosis present

## 2022-05-13 LAB — URINALYSIS, ROUTINE W REFLEX MICROSCOPIC
Bilirubin Urine: NEGATIVE
Glucose, UA: NEGATIVE mg/dL
Hgb urine dipstick: NEGATIVE
Ketones, ur: NEGATIVE mg/dL
Leukocytes,Ua: NEGATIVE
Nitrite: NEGATIVE
Protein, ur: NEGATIVE mg/dL
Specific Gravity, Urine: 1.02 (ref 1.005–1.030)
pH: 6.5 (ref 5.0–8.0)

## 2022-05-13 LAB — COMPREHENSIVE METABOLIC PANEL
ALT: 20 U/L (ref 0–44)
AST: 29 U/L (ref 15–41)
Albumin: 3.5 g/dL (ref 3.5–5.0)
Alkaline Phosphatase: 69 U/L (ref 38–126)
Anion gap: 6 (ref 5–15)
BUN: 17 mg/dL (ref 8–23)
CO2: 26 mmol/L (ref 22–32)
Calcium: 9 mg/dL (ref 8.9–10.3)
Chloride: 102 mmol/L (ref 98–111)
Creatinine, Ser: 1.06 mg/dL — ABNORMAL HIGH (ref 0.44–1.00)
GFR, Estimated: 51 mL/min — ABNORMAL LOW (ref >60)
Glucose, Bld: 96 mg/dL (ref 70–99)
Potassium: 4.1 mmol/L (ref 3.5–5.1)
Sodium: 134 mmol/L — ABNORMAL LOW (ref 135–145)
Total Bilirubin: 0.9 mg/dL (ref 0.3–1.2)
Total Protein: 6.2 g/dL — ABNORMAL LOW (ref 6.5–8.1)

## 2022-05-13 LAB — CBC WITH DIFFERENTIAL/PLATELET
Abs Immature Granulocytes: 0.02 10*3/uL (ref 0.00–0.07)
Basophils Absolute: 0.1 10*3/uL (ref 0.0–0.1)
Basophils Relative: 1 %
Eosinophils Absolute: 0.3 10*3/uL (ref 0.0–0.5)
Eosinophils Relative: 6 %
HCT: 32.6 % — ABNORMAL LOW (ref 36.0–46.0)
Hemoglobin: 10.8 g/dL — ABNORMAL LOW (ref 12.0–15.0)
Immature Granulocytes: 0 %
Lymphocytes Relative: 26 %
Lymphs Abs: 1.4 10*3/uL (ref 0.7–4.0)
MCH: 32.7 pg (ref 26.0–34.0)
MCHC: 33.1 g/dL (ref 30.0–36.0)
MCV: 98.8 fL (ref 80.0–100.0)
Monocytes Absolute: 1 10*3/uL (ref 0.1–1.0)
Monocytes Relative: 19 %
Neutro Abs: 2.6 10*3/uL (ref 1.7–7.7)
Neutrophils Relative %: 48 %
Platelets: 277 10*3/uL (ref 150–400)
RBC: 3.3 MIL/uL — ABNORMAL LOW (ref 3.87–5.11)
RDW: 14.3 % (ref 11.5–15.5)
WBC: 5.5 10*3/uL (ref 4.0–10.5)
nRBC: 0 % (ref 0.0–0.2)

## 2022-05-13 MED ORDER — SODIUM CHLORIDE 0.9 % IV BOLUS
1000.0000 mL | Freq: Once | INTRAVENOUS | Status: AC
  Administered 2022-05-13: 1000 mL via INTRAVENOUS

## 2022-05-13 NOTE — ED Provider Notes (Signed)
[facility] EMERGENCY DEPARTMENT Provider Note   CSN: [id number] Arrival date & time: 05/13/22  1250     History  Chief Complaint  Patient presents with   Wound Check    Vickie Roberts is a 88 y.o. female.  Patient here for evaluation of wound on her left upper arm.  Scratched by cat.  Tetanus shot is up-to-date.  She was prescribed doxycycline but took 1 dose and family did not like how it made her react.  She has multiple drug allergies.  This scratch occurred about 5 days ago.  They have been using topical antibiotics and soap and water.  She has not had fever.  She has been a little bit more weak and lethargic this past week as well.  No fever or chills.  The concern for may be urinary tract infection.  She did have recent stroke.  She has 24/7 care at home.  Patient herself has no complaints.  No chest pain or shortness of breath or abdominal pain.  The history is provided by the patient and a caregiver.       Home Medications Prior to Admission medications   Medication Sig Start Date End Date Taking? Authorizing Provider  acetaminophen (TYLENOL) 500 MG tablet Take by mouth.    [provider]  Biotin 1 MG CAPS Take by mouth.    [provider]  buPROPion (WELLBUTRIN SR) 150 MG 12 hr tablet Take 1 tablet (150 mg total) by mouth daily. 04/13/22   Cottle, Steva Ready., MD  cholecalciferol (VITAMIN D) 1000 UNITS tablet Take 1,000 Units by mouth daily.    [provider]  clonazePAM (KLONOPIN) 0.5 MG tablet Take 0.5 tablets (0.25 mg total) by mouth 2 (two) times daily as needed for anxiety. 04/13/22   Cottle, Steva Ready., MD  diphenoxylate-atropine (LOMOTIL) 2.5-0.025 MG tablet Take by mouth.    [provider]  gabapentin (NEURONTIN) 100 MG capsule Take 100 mg by mouth 2 (two) times daily. 12/25/18   [provider]  L-Methylfolate-Algae (DEPLIN 7.5 PO) Take by mouth.    [provider]  leflunomide (ARAVA) 20 MG tablet   06/08/15   [provider]  omeprazole (PRILOSEC) 20 MG capsule Take by mouth.    [provider]  ondansetron (ZOFRAN) 4 MG tablet  06/21/19   [provider]  predniSONE (DELTASONE) 5 MG tablet Take 5 mg by mouth daily with breakfast.    [provider]  tizanidine (ZANAFLEX) 2 MG capsule Take 1 mg by mouth daily.    [provider]      Allergies    Codeine, Paroxetine, Tramadol, Donepezil, Erythromycin, Hydrocodone, Keflex [cephalexin], and Sulfonamide derivatives    Review of Systems   Review of Systems  Physical Exam Updated Vital Signs BP (!) 164/94   Pulse 88   Temp 98.8 F (37.1 C) (Oral)   Resp (!) 29   SpO2 96%  Physical Exam Vitals and nursing note reviewed.  Constitutional:      General: She is not in acute distress.    Appearance: She is well-developed. She is not ill-appearing.  HENT:     Head: Normocephalic and atraumatic.     Nose: Nose normal.     Mouth/Throat:     Mouth: Mucous membranes are moist.  Eyes:     Extraocular Movements: Extraocular movements intact.     Conjunctiva/sclera: Conjunctivae normal.     Pupils: Pupils are equal, round, and reactive to light.  Cardiovascular:     Rate and Rhythm: Normal rate and regular rhythm.     Heart sounds: No murmur heard. Pulmonary:     Effort: Pulmonary effort is normal. No respiratory distress.     Breath sounds: Normal breath sounds.  Abdominal:     Palpations: Abdomen is soft.     Tenderness: There is no abdominal tenderness.  Musculoskeletal:        General: No swelling.     Cervical back: Neck supple.  Skin:    General: Skin is warm and dry.     Capillary Refill: Capillary refill takes less than 2 seconds.     Comments: Abrasion/small skin tear to the left upper extremity with no signs of purulence or erythema, area is clean dry and intact  Neurological:     General: No focal deficit present.     Mental Status: She is alert. Mental status is at  baseline.     Cranial Nerves: No cranial nerve deficit.     Sensory: No sensory deficit.     Motor: No weakness.  Psychiatric:        Mood and Affect: Mood normal.     ED Results / Procedures / Treatments   Labs (all labs ordered are listed, but only abnormal results are displayed) Labs Reviewed  CBC WITH DIFFERENTIAL/PLATELET - Abnormal; Notable for the following components:      Result Value   RBC 3.30 (*)    Hemoglobin 10.8 (*)    HCT 32.6 (*)    All other components within normal limits  COMPREHENSIVE METABOLIC PANEL - Abnormal; Notable for the following components:   Sodium 134 (*)    Creatinine, Ser 1.06 (*)    Total Protein 6.2 (*)    GFR, Estimated 51 (*)    All other components within normal limits  URINALYSIS, ROUTINE W REFLEX MICROSCOPIC    EKG EKG Interpretation  Date/Time:  Friday May 13 2022 14:11:57 EDT Ventricular Rate:  88 PR Interval:  217 QRS Duration: 99 QT Interval:  371 QTC Calculation: 449 R Axis:   -74 Text Interpretation: Sinus rhythm Borderline prolonged PR interval Confirmed by Virgina Norfolk ([id number]) on 05/13/2022 2:31:51 PM  Radiology CT Head Wo Contrast  Result Date: 05/13/2022 CLINICAL DATA:  Mental status change, unknown cause EXAM: CT HEAD WITHOUT CONTRAST TECHNIQUE: Contiguous axial images were obtained from the base of the skull through the vertex without intravenous contrast. RADIATION DOSE REDUCTION: This exam was performed according to the departmental dose-optimization program which includes automated exposure control, adjustment of the mA and/or kV according to patient size and/or use of iterative reconstruction technique. COMPARISON:  04/23/2022 FINDINGS: Brain: No evidence of acute infarction, hemorrhage, hydrocephalus, extra-axial collection or mass lesion/mass effect. Prominence of the extra-axial spaces in the bilateral frontal regions, right greater than left is unchanged and may be related to atrophy versus arachnoid cyst. Patchy  low-density changes within the periventricular and subcortical white matter compatible with chronic microvascular ischemic change. Vascular: Atherosclerotic calcifications involving the large vessels of the skull base. No unexpected hyperdense vessel. Skull: Normal. Negative for fracture or focal lesion. Sinuses/Orbits: No acute finding. Other: None. IMPRESSION: 1. No acute intracranial abnormality. 2. Chronic microvascular ischemic change and cerebral volume loss. Electronically Signed   By: Duanne Guess D.O.   On: 05/13/2022 14:07   DG Chest Portable 1 View  Result Date: 05/13/2022 CLINICAL DATA:  Scratch by cat in the left upper arm 2 days ago. Placed on antibiotics. Here for wound  check. EXAM: PORTABLE CHEST 1 VIEW COMPARISON:  11/01/2021. FINDINGS: Cardiac silhouette is normal in size and configuration. No mediastinal or hilar masses. Clear lungs.  No pleural effusion or pneumothorax. Skeletal structures are grossly intact. IMPRESSION: No active disease. Electronically Signed   By: Amie Portland M.D.   On: 05/13/2022 13:58    Procedures Procedures    Medications Ordered in ED Medications  sodium chloride 0.9 % bolus 1,000 mL (1,000 mLs Intravenous New Bag/Given 05/13/22 1421)    ED Course/ Medical Decision Making/ A&P                           Medical Decision Making Amount and/or Complexity of Data Reviewed Labs: ordered. Radiology: ordered.   Vickie Roberts is here with generalized weakness, reevaluation of wound to her left upper arm.  Patient with history of stroke, dementia.  Patient here with caregivers.  She was scratched by cat several days ago.  Had a tetanus shot up-to-date already.  Was started on doxycycline but did not like how that made her feel so they have stopped antibiotics.  She has not had any antibiotics the last 4 days.  Wound overall appears clean dry and without any signs of cellulitis or major infection.  They have been using topical bacitracin.  Overall she is  got multiple drug allergies especially with antibiotics.  Family really does not want to do any more antibiotics which I think is reasonable as she seems to be responding well to topical antibiotics and there does not appear to be any major infectious process going on at the site.  She has been a little bit more lethargic this past week and they were worried about a urine infection.  Neurologically she appears to be at her baseline.  Her vital signs are normal.  She has no fever.  I obtained a CBC, CMP, head CT, chest x-ray, urinalysis to further evaluate for infectious process versus dehydration versus electrolyte abnormality.  Per my review and interpretation of labs is no significant anemia, electrolyte abnormality, leukocytosis.  Head CT and chest x-ray per my review and interpretation showed no acute findings.  Urinalysis negative for infection.  Overall patient doing better after some IV fluids.  As stated before shared decision was made to continue topical antibiotics for cat scratch.  They understand return precautions.  Discharged in good condition.  This chart was dictated using voice recognition software.  Despite best efforts to proofread,  errors can occur which can change the documentation meaning.         Final Clinical Impression(s) / ED Diagnoses Final diagnoses:  Cat scratch    Rx / DC Orders ED Discharge Orders     None         Virgina Norfolk, DO 05/13/22 1455

## 2022-05-13 NOTE — ED Triage Notes (Addendum)
Patient presents to ED via POV from home with daughter. Patient was scratched (left upper arm) by her cat 2 days ago. Patient was seen by PCP for same, placed on antibiotics. Here for a wound recheck. Daughter reports "significant decline" since scratch.

## 2022-05-13 NOTE — Discharge Instructions (Signed)
Lab work is normal.  Continue bacitracin ointment or Neosporin ointment twice a day to cat scratch.  Follow-up with primary care doctor if you notice any signs of worsening infection as she may need oral antibiotics.  At this time I think it is reasonable to continue to hold off any further oral antibiotics.

## 2022-05-13 NOTE — ED Notes (Signed)
IV taken out.

## 2023-01-12 ENCOUNTER — Telehealth: Payer: Self-pay | Admitting: Psychiatry

## 2023-01-12 NOTE — Telephone Encounter (Signed)
[name] daughter called. She said that her mother, Vickie Roberts passed away in 11-19-2023.

## 2023-01-12 NOTE — Telephone Encounter (Signed)
Noted! Thank you

## 2023-01-16 ENCOUNTER — Telehealth: Payer: Medicare Other | Admitting: Psychiatry
